# Patient Record
Sex: Female | Born: 1985 | Race: White | Hispanic: No | Marital: Single | Smoking: Current every day smoker
Health system: Southern US, Community
[De-identification: ages and names within clinical notes are randomized; demographics above are authoritative.]

## PROBLEM LIST (undated history)

## (undated) DIAGNOSIS — F1910 Other psychoactive substance abuse, uncomplicated: Secondary | ICD-10-CM

---

## 2015-03-04 ENCOUNTER — Emergency Department (HOSPITAL_COMMUNITY): Payer: Medicaid Other

## 2015-03-04 ENCOUNTER — Emergency Department (HOSPITAL_COMMUNITY)
Admission: EM | Admit: 2015-03-04 | Discharge: 2015-03-04 | Disposition: A | Discharge status: Home / Self Care | Payer: Medicaid Other | Attending: Emergency Medicine | Admitting: Emergency Medicine

## 2015-03-04 DIAGNOSIS — S3992XA Unspecified injury of lower back, initial encounter: Secondary | ICD-10-CM | POA: Insufficient documentation

## 2015-03-04 DIAGNOSIS — M25551 Pain in right hip: Secondary | ICD-10-CM

## 2015-03-04 DIAGNOSIS — M5440 Lumbago with sciatica, unspecified side: Secondary | ICD-10-CM | POA: Insufficient documentation

## 2015-03-04 DIAGNOSIS — Y9389 Activity, other specified: Secondary | ICD-10-CM | POA: Insufficient documentation

## 2015-03-04 DIAGNOSIS — R52 Pain, unspecified: Secondary | ICD-10-CM

## 2015-03-04 DIAGNOSIS — Y998 Other external cause status: Secondary | ICD-10-CM | POA: Diagnosis not present

## 2015-03-04 DIAGNOSIS — S8991XA Unspecified injury of right lower leg, initial encounter: Secondary | ICD-10-CM | POA: Diagnosis not present

## 2015-03-04 DIAGNOSIS — S79911A Unspecified injury of right hip, initial encounter: Secondary | ICD-10-CM | POA: Diagnosis not present

## 2015-03-04 DIAGNOSIS — M545 Low back pain: Secondary | ICD-10-CM

## 2015-03-04 DIAGNOSIS — Y9289 Other specified places as the place of occurrence of the external cause: Secondary | ICD-10-CM | POA: Insufficient documentation

## 2015-03-04 DIAGNOSIS — M79661 Pain in right lower leg: Secondary | ICD-10-CM

## 2015-03-04 LAB — CBC
HCT: 31.7 % — ABNORMAL LOW (ref 36.0–46.0)
Hemoglobin: 10.4 g/dL — ABNORMAL LOW (ref 12.0–15.0)
MCH: 28.2 pg (ref 26.0–34.0)
MCHC: 32.8 g/dL (ref 30.0–36.0)
MCV: 85.9 fL (ref 78.0–100.0)
PLATELETS: 280 10*3/uL (ref 150–400)
RBC: 3.69 MIL/uL — ABNORMAL LOW (ref 3.87–5.11)
RDW: 13.3 % (ref 11.5–15.5)
WBC: 9.2 10*3/uL (ref 4.0–10.5)

## 2015-03-04 LAB — SAMPLE TO BLOOD BANK

## 2015-03-04 LAB — COMPREHENSIVE METABOLIC PANEL
ALK PHOS: 41 U/L (ref 38–126)
ALT: 21 U/L (ref 14–54)
AST: 21 U/L (ref 15–41)
Albumin: 3.8 g/dL (ref 3.5–5.0)
Anion gap: 8 (ref 5–15)
BUN: 14 mg/dL (ref 6–20)
CALCIUM: 8.9 mg/dL (ref 8.9–10.3)
CHLORIDE: 105 mmol/L (ref 101–111)
CO2: 26 mmol/L (ref 22–32)
CREATININE: 0.89 mg/dL (ref 0.44–1.00)
Glucose, Bld: 57 mg/dL — ABNORMAL LOW (ref 65–99)
Potassium: 3.5 mmol/L (ref 3.5–5.1)
Sodium: 139 mmol/L (ref 135–145)
Total Bilirubin: 0.4 mg/dL (ref 0.3–1.2)
Total Protein: 6.6 g/dL (ref 6.5–8.1)

## 2015-03-04 LAB — PROTIME-INR
INR: 1.02 (ref 0.00–1.49)
Prothrombin Time: 13.6 seconds (ref 11.6–15.2)

## 2015-03-04 LAB — ETHANOL

## 2015-03-04 MED ORDER — ONDANSETRON HCL 4 MG/2ML IJ SOLN: 4.0000 mg | Freq: Four times a day (QID) | INTRAMUSCULAR | Status: DC | PRN

## 2015-03-04 MED ORDER — MORPHINE SULFATE (PF) 4 MG/ML IV SOLN: 4.0000 mg | INTRAVENOUS | Status: DC | PRN

## 2015-03-04 NOTE — ED Notes (Signed)
Family at beside. Family given emotional support. 
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
## 2015-03-04 NOTE — ED Notes (Signed)
The patient left prior to being discharged, walked out without any complications.  She asked if she could "walk out" and I advised her we could not keep her.  So she left with a family member.

## 2015-03-04 NOTE — ED Notes (Signed)
The patient was able to ambulate in the hall.
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
## 2015-03-04 NOTE — ED Notes (Signed)
Pt ambulated out the room multiple times. She was walking with no abnormalities.
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
## 2015-03-04 NOTE — ED Notes (Signed)
Patient transported to X-ray 

## 2015-03-04 NOTE — ED Notes (Signed)
CBG was 102

## 2015-03-05 LAB — CBG MONITORING, ED: GLUCOSE-CAPILLARY: 102 mg/dL — AB (ref 65–99)

## 2016-07-13 ENCOUNTER — Inpatient Hospital Stay (HOSPITAL_COMMUNITY)
Admission: EM | Admit: 2016-07-13 | Discharge: 2016-07-16 | DRG: 872 | Disposition: A | Discharge status: Home / Self Care | Payer: Medicaid Other | Attending: Internal Medicine | Admitting: Internal Medicine

## 2016-07-13 ENCOUNTER — Encounter (HOSPITAL_COMMUNITY): Payer: Self-pay

## 2016-07-13 DIAGNOSIS — E871 Hypo-osmolality and hyponatremia: Secondary | ICD-10-CM | POA: Diagnosis present

## 2016-07-13 DIAGNOSIS — I959 Hypotension, unspecified: Secondary | ICD-10-CM | POA: Diagnosis present

## 2016-07-13 DIAGNOSIS — R0989 Other specified symptoms and signs involving the circulatory and respiratory systems: Secondary | ICD-10-CM | POA: Diagnosis present

## 2016-07-13 DIAGNOSIS — E876 Hypokalemia: Secondary | ICD-10-CM

## 2016-07-13 DIAGNOSIS — A419 Sepsis, unspecified organism: Principal | ICD-10-CM | POA: Diagnosis present

## 2016-07-13 DIAGNOSIS — F1990 Other psychoactive substance use, unspecified, uncomplicated: Secondary | ICD-10-CM | POA: Diagnosis not present

## 2016-07-13 DIAGNOSIS — N12 Tubulo-interstitial nephritis, not specified as acute or chronic: Secondary | ICD-10-CM

## 2016-07-13 DIAGNOSIS — D72829 Elevated white blood cell count, unspecified: Secondary | ICD-10-CM

## 2016-07-13 DIAGNOSIS — D649 Anemia, unspecified: Secondary | ICD-10-CM | POA: Diagnosis present

## 2016-07-13 DIAGNOSIS — F1510 Other stimulant abuse, uncomplicated: Secondary | ICD-10-CM | POA: Diagnosis present

## 2016-07-13 DIAGNOSIS — R05 Cough: Secondary | ICD-10-CM | POA: Diagnosis present

## 2016-07-13 DIAGNOSIS — R109 Unspecified abdominal pain: Secondary | ICD-10-CM

## 2016-07-13 DIAGNOSIS — Z87440 Personal history of urinary (tract) infections: Secondary | ICD-10-CM

## 2016-07-13 DIAGNOSIS — Z1612 Extended spectrum beta lactamase (ESBL) resistance: Secondary | ICD-10-CM

## 2016-07-13 DIAGNOSIS — A498 Other bacterial infections of unspecified site: Secondary | ICD-10-CM

## 2016-07-13 HISTORY — DX: Other psychoactive substance abuse, uncomplicated: F19.10

## 2016-07-13 LAB — CBC WITH DIFFERENTIAL/PLATELET
BASOS PCT: 0 %
Basophils Absolute: 0 10*3/uL (ref 0.0–0.1)
EOS ABS: 0 10*3/uL (ref 0.0–0.7)
EOS PCT: 0 %
HCT: 31.7 % — ABNORMAL LOW (ref 36.0–46.0)
HEMOGLOBIN: 10.7 g/dL — AB (ref 12.0–15.0)
Lymphocytes Relative: 11 %
Lymphs Abs: 1.6 10*3/uL (ref 0.7–4.0)
MCH: 28.8 pg (ref 26.0–34.0)
MCHC: 33.8 g/dL (ref 30.0–36.0)
MCV: 85.4 fL (ref 78.0–100.0)
MONOS PCT: 11 %
Monocytes Absolute: 1.6 10*3/uL — ABNORMAL HIGH (ref 0.1–1.0)
NEUTROS PCT: 78 %
Neutro Abs: 10.8 10*3/uL — ABNORMAL HIGH (ref 1.7–7.7)
PLATELETS: 244 10*3/uL (ref 150–400)
RBC: 3.71 MIL/uL — ABNORMAL LOW (ref 3.87–5.11)
RDW: 12.8 % (ref 11.5–15.5)
WBC: 14 10*3/uL — AB (ref 4.0–10.5)

## 2016-07-13 LAB — COMPREHENSIVE METABOLIC PANEL
ALK PHOS: 50 U/L (ref 38–126)
ALT: 29 U/L (ref 14–54)
AST: 35 U/L (ref 15–41)
Albumin: 3.1 g/dL — ABNORMAL LOW (ref 3.5–5.0)
Anion gap: 8 (ref 5–15)
BUN: 8 mg/dL (ref 6–20)
CALCIUM: 8.1 mg/dL — AB (ref 8.9–10.3)
CO2: 27 mmol/L (ref 22–32)
CREATININE: 0.68 mg/dL (ref 0.44–1.00)
Chloride: 97 mmol/L — ABNORMAL LOW (ref 101–111)
Glucose, Bld: 126 mg/dL — ABNORMAL HIGH (ref 65–99)
Potassium: 2.9 mmol/L — ABNORMAL LOW (ref 3.5–5.1)
Sodium: 132 mmol/L — ABNORMAL LOW (ref 135–145)
Total Bilirubin: 0.6 mg/dL (ref 0.3–1.2)
Total Protein: 6.8 g/dL (ref 6.5–8.1)

## 2016-07-13 LAB — URINALYSIS, ROUTINE W REFLEX MICROSCOPIC
Bilirubin Urine: NEGATIVE
GLUCOSE, UA: NEGATIVE mg/dL
Ketones, ur: NEGATIVE mg/dL
NITRITE: NEGATIVE
PH: 6 (ref 5.0–8.0)
PROTEIN: NEGATIVE mg/dL
SPECIFIC GRAVITY, URINE: 1.002 — AB (ref 1.005–1.030)

## 2016-07-13 LAB — I-STAT CG4 LACTIC ACID, ED: Lactic Acid, Venous: 1.58 mmol/L (ref 0.5–1.9)

## 2016-07-13 MED ORDER — ORAL CARE MOUTH RINSE: 15.0000 mL | Freq: Two times a day (BID) | OROMUCOSAL | Status: DC

## 2016-07-13 MED ORDER — SODIUM CHLORIDE 0.9 % IV BOLUS (SEPSIS)
1000.0000 mL | Freq: Once | INTRAVENOUS | Status: AC
  Administered 2016-07-13: 1000 mL via INTRAVENOUS

## 2016-07-13 MED ORDER — ENSURE ENLIVE PO LIQD
237.0000 mL | Freq: Two times a day (BID) | ORAL | Status: DC
  Administered 2016-07-14 – 2016-07-16 (×3): 237 mL via ORAL

## 2016-07-13 MED ORDER — VANCOMYCIN HCL IN DEXTROSE 750-5 MG/150ML-% IV SOLN
750.0000 mg | Freq: Three times a day (TID) | INTRAVENOUS | Status: DC
  Administered 2016-07-13 – 2016-07-14 (×2): 750 mg via INTRAVENOUS
  Filled 2016-07-13 (×3): qty 150

## 2016-07-13 MED ORDER — ENOXAPARIN SODIUM 40 MG/0.4ML ~~LOC~~ SOLN
40.0000 mg | SUBCUTANEOUS | Status: DC
  Administered 2016-07-13 – 2016-07-14 (×2): 40 mg via SUBCUTANEOUS
  Filled 2016-07-13 (×3): qty 0.4

## 2016-07-13 MED ORDER — ACETAMINOPHEN 650 MG RE SUPP: 650.0000 mg | Freq: Four times a day (QID) | RECTAL | Status: DC | PRN

## 2016-07-13 MED ORDER — SODIUM CHLORIDE 0.9 % IV SOLN
INTRAVENOUS | Status: DC
  Administered 2016-07-13: 1000 mL via INTRAVENOUS
  Administered 2016-07-14 – 2016-07-16 (×4): via INTRAVENOUS

## 2016-07-13 MED ORDER — ACETAMINOPHEN 325 MG PO TABS
650.0000 mg | ORAL_TABLET | Freq: Four times a day (QID) | ORAL | Status: DC | PRN
  Administered 2016-07-13 – 2016-07-14 (×2): 650 mg via ORAL
  Filled 2016-07-13 (×3): qty 2

## 2016-07-13 MED ORDER — DEXTROSE 5 % IV SOLN
1.0000 g | Freq: Three times a day (TID) | INTRAVENOUS | Status: DC
  Administered 2016-07-13 – 2016-07-16 (×8): 1 g via INTRAVENOUS
  Filled 2016-07-13 (×10): qty 1

## 2016-07-13 MED ORDER — CHLORHEXIDINE GLUCONATE 0.12 % MT SOLN
15.0000 mL | Freq: Two times a day (BID) | OROMUCOSAL | Status: DC
  Administered 2016-07-14 – 2016-07-16 (×4): 15 mL via OROMUCOSAL
  Filled 2016-07-13 (×6): qty 15

## 2016-07-13 MED ORDER — VANCOMYCIN HCL IN DEXTROSE 1-5 GM/200ML-% IV SOLN
1000.0000 mg | Freq: Once | INTRAVENOUS | Status: AC
  Administered 2016-07-13: 1000 mg via INTRAVENOUS
  Filled 2016-07-13: qty 200

## 2016-07-13 MED ORDER — PIPERACILLIN-TAZOBACTAM 3.375 G IVPB 30 MIN
3.3750 g | Freq: Once | INTRAVENOUS | Status: AC
  Administered 2016-07-13: 3.375 g via INTRAVENOUS
  Filled 2016-07-13: qty 50

## 2016-07-13 NOTE — ED Notes (Signed)
Bed: WA04 Expected date:  Expected time:  Means of arrival:  Comments: 31 yo sepsis
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
## 2016-07-13 NOTE — Progress Notes (Signed)
Pharmacy Antibiotic Note  Christine CrewsCasey Mcguire is a 31 y.o. female admitted on 07/13/2016 with sepsis. Pt states that for the past 3 days she has had flank pain, dysuria, dark urine, generalized weakness, nausea, anorexia, fever.  Patient acknowledges using meth.  No clear precipitant. Pharmacy initally consulted for vancomycin and zosyn (changed zosyn to cefepime) dosing, received zosyn 3.375 mg x 1 and vancomycin 1gm x 1 in ED.  Plan: Vancomycin 750mg  IV q8h Cefepime 1gm IV q8h Follow renal function, cultures and clinical course vanc trough at steady state  Height: 5\' 3"  (160 cm) Weight: 140 lb (63.5 kg) IBW/kg (Calculated) : 52.4  Temp (24hrs), Avg:99.1 F (37.3 C), Min:99.1 F (37.3 C), Max:99.1 F (37.3 C)   Recent Labs Lab 07/13/16 1152 07/13/16 1200  WBC 14.0*  --   CREATININE 0.68  --   LATICACIDVEN  --  1.58    Estimated Creatinine Clearance: 93.9 mL/min (by C-G formula based on SCr of 0.68 mg/dL).    No Known Allergies  Antimicrobials this admission: 5/3 vancomycin >> 5/3 zosyn x1 5/3 cefepime >>   Microbiology results: 5/3 BCx: sent  Thank you for allowing pharmacy to be a part of this patient's care.  Arley Phenixllen Donjuan Robison RPh 07/13/2016, 1:49 PM Pager [phone]

## 2016-07-13 NOTE — H&P (Signed)
History and Physical    Christine Mcguire ZOX:[id number] DOB: Sep 29, 1985 DOA: 07/13/2016  I have briefly reviewed the patient's prior medical records in [facility]  PCP: No PCP Per Patient  Patient coming from: Home  Chief Complaint: Flank pain, dysuria  HPI: Christine Mcguire is a 31 y.o. female with medical history significant of drug abuse, presents to the emergency room from an urgent care center with 3 days of flank pain on the right side, burning with urination, dark urine as well as subjective fever and chills.  She also has had nausea, generalized weakness.  She tells me she has had kidney infections in the past.  She denies any chest pain or shortness of breath.  She is somewhat sleepy in the emergency room however is able to wake up and answers questions appropriately.  She has a history of drug abuse with meth, no other drugs or alcohol.  She denies any lightheadedness or dizziness.  No cough or chest congestion.  ED Course: In the ED she was initially hypotensive, low-grade temp, she was treated this sepsis and had blood cultures, she was started on vancomycin and cefepime empirically.  Blood work showed elevated white count of 14.  TR[facility] was asked for admission for sepsis due to pyelonephritis.  UA grossly positive.  Review of Systems: As per HPI otherwise 10 point review of systems negative.   Past Medical History:  Diagnosis Date  . Drug abuse     History reviewed. No pertinent surgical history.   reports that she has never smoked. She has never used smokeless tobacco. She reports that she uses drugs, including Methamphetamines. She reports that she does not drink alcohol.  No Known Allergies  History reviewed. No pertinent family history.  Prior to Admission medications   Medication Sig Start Date End Date Taking? Authorizing Provider  ibuprofen (ADVIL,MOTRIN) 200 MG tablet Take 400 mg by mouth every 6 (six) hours as needed.   Yes Historical Provider, MD    Physical  Exam: Vitals:   07/13/16 1151 07/13/16 1206 07/13/16 1330  BP:  (!) 97/48 (!) 112/53  Pulse:  96 94  Resp:  17 15  Temp:  99.1 F (37.3 C) 97.5 F (36.4 C)  TempSrc:  Oral Oral  SpO2: 95% 100% 100%  Weight:  63.5 kg (140 lb)   Height:  5\' 3"  (1.6 m)     Constitutional: No apparent distress, sleepy Vitals:   07/13/16 1151 07/13/16 1206 07/13/16 1330  BP:  (!) 97/48 (!) 112/53  Pulse:  96 94  Resp:  17 15  Temp:  99.1 F (37.3 C) 97.5 F (36.4 C)  TempSrc:  Oral Oral  SpO2: 95% 100% 100%  Weight:  63.5 kg (140 lb)   Height:  5\' 3"  (1.6 m)    Eyes: PERRL, lids and conjunctivae normal ENMT: Mucous membranes are moist. Posterior pharynx clear of any exudate or lesions. Neck: normal, supple, no masses, no thyromegaly Respiratory: clear to auscultation bilaterally, no wheezing, no crackles. Normal respiratory effort. No accessory muscle use.  Cardiovascular: Regular rate and rhythm, no murmurs / rubs / gallops. No extremity edema. 2+ pedal pulses.  Abdomen: Tenderness on right upper quadrant. Bowel sounds positive.  Musculoskeletal: no clubbing / cyanosis. Normal muscle tone.  Has CVA tenderness on the right Skin: Multiple cuts bilateral lower extremities Neurologic: CN 2-12 grossly intact. Strength 5/5 in all 4.  Psychiatric: Normal judgment and insight. Alert and oriented x 3. Normal mood.   Labs on Admission:  I have personally reviewed following labs and imaging studies  CBC:  Recent Labs Lab 07/13/16 1152  WBC 14.0*  NEUTROABS 10.8*  HGB 10.7*  HCT 31.7*  MCV 85.4  PLT 244   Basic Metabolic Panel:  Recent Labs Lab 07/13/16 1152  NA 132*  K 2.9*  CL 97*  CO2 27  GLUCOSE 126*  BUN 8  CREATININE 0.68  CALCIUM 8.1*   GFR: Estimated Creatinine Clearance: 93.9 mL/min (by C-G formula based on SCr of 0.68 mg/dL). Liver Function Tests:  Recent Labs Lab 07/13/16 1152  AST 35  ALT 29  ALKPHOS 50  BILITOT 0.6  PROT 6.8  ALBUMIN 3.1*   No results for  input(s): LIPASE, AMYLASE in the last 168 hours. No results for input(s): AMMONIA in the last 168 hours. Coagulation Profile: No results for input(s): INR, PROTIME in the last 168 hours. Cardiac Enzymes: No results for input(s): CKTOTAL, CKMB, CKMBINDEX, TROPONINI in the last 168 hours. BNP (last 3 results) No results for input(s): PROBNP in the last 8760 hours. HbA1C: No results for input(s): HGBA1C in the last 72 hours. CBG: No results for input(s): GLUCAP in the last 168 hours. Lipid Profile: No results for input(s): CHOL, HDL, LDLCALC, TRIG, CHOLHDL, LDLDIRECT in the last 72 hours. Thyroid Function Tests: No results for input(s): TSH, T4TOTAL, FREET4, T3FREE, THYROIDAB in the last 72 hours. Anemia Panel: No results for input(s): VITAMINB12, FOLATE, FERRITIN, TIBC, IRON, RETICCTPCT in the last 72 hours. Urine analysis: No results found for: COLORURINE, APPEARANCEUR, LABSPEC, PHURINE, GLUCOSEU, HGBUR, BILIRUBINUR, KETONESUR, PROTEINUR, UROBILINOGEN, NITRITE, LEUKOCYTESUR   Radiological Exams on Admission: No results found.  EKG: Independently reviewed.  Sinus rhythm  Assessment/Plan Active Problems:   Pyelonephritis   Drug use   She is due to pyelonephritis -Admit patient to the hospital, given septic picture on presentation and history of drug use, agree with vancomycin and cefepime for now while awaiting blood cultures -Obtain HIV, obtain urine cultures as well  Drug use -Patient with somewhat flat flattened affect when this is brought up   DVT prophylaxis: Lovenox Code Status: Full code Family Communication: No family at bedside Disposition Plan: Admit to MedSurg Consults called: None    Admission status: Observation  At the point of initial evaluation, it is my clinical opinion that admission for OBSERVATION is reasonable and necessary because the patient's presenting complaints in the context of their chronic conditions represent sufficient risk of  deterioration or significant morbidity to constitute reasonable grounds for close observation in the hospital setting, but that the patient may be medically stable for discharge from the hospital within 24 to 48 hours.    Pamella Pertostin Gurman [place], MD [facility] Pager [phone]  If 7PM-7AM, please contact night-coverage [email or web address] Password [email or web address]  07/13/2016, 2:30 PM

## 2016-07-13 NOTE — ED Triage Notes (Signed)
Per EMS, pt sent from urgent care for sepsis.  Pt complains of bilateral flank pain radiating to vaginal area, dark cloudy urine, fever, malaise, dizziness x 4 days. Pt has hx of IV methamphetamine use, last used 4 days ago. Pt received 1000mg  tylenol, 800mg  ibuprofen, 500cc NS en route.    BP 110/66 HR 103 RR 20 T 103.7 orally CBG 147

## 2016-07-14 ENCOUNTER — Observation Stay (HOSPITAL_COMMUNITY): Payer: Self-pay

## 2016-07-14 DIAGNOSIS — D72829 Elevated white blood cell count, unspecified: Secondary | ICD-10-CM

## 2016-07-14 DIAGNOSIS — F1990 Other psychoactive substance use, unspecified, uncomplicated: Secondary | ICD-10-CM | POA: Diagnosis not present

## 2016-07-14 DIAGNOSIS — R109 Unspecified abdominal pain: Secondary | ICD-10-CM

## 2016-07-14 DIAGNOSIS — N12 Tubulo-interstitial nephritis, not specified as acute or chronic: Secondary | ICD-10-CM | POA: Diagnosis not present

## 2016-07-14 DIAGNOSIS — E876 Hypokalemia: Secondary | ICD-10-CM

## 2016-07-14 LAB — CBC
HEMATOCRIT: 31.3 % — AB (ref 36.0–46.0)
Hemoglobin: 10.5 g/dL — ABNORMAL LOW (ref 12.0–15.0)
MCH: 28.8 pg (ref 26.0–34.0)
MCHC: 33.5 g/dL (ref 30.0–36.0)
MCV: 85.8 fL (ref 78.0–100.0)
Platelets: 182 10*3/uL (ref 150–400)
RBC: 3.65 MIL/uL — ABNORMAL LOW (ref 3.87–5.11)
RDW: 13 % (ref 11.5–15.5)
WBC: 11 10*3/uL — ABNORMAL HIGH (ref 4.0–10.5)

## 2016-07-14 LAB — COMPREHENSIVE METABOLIC PANEL
ALBUMIN: 2.5 g/dL — AB (ref 3.5–5.0)
ALT: 25 U/L (ref 14–54)
AST: 28 U/L (ref 15–41)
Alkaline Phosphatase: 44 U/L (ref 38–126)
Anion gap: 7 (ref 5–15)
BILIRUBIN TOTAL: 0.4 mg/dL (ref 0.3–1.2)
BUN: 9 mg/dL (ref 6–20)
CALCIUM: 7.7 mg/dL — AB (ref 8.9–10.3)
CO2: 24 mmol/L (ref 22–32)
Chloride: 105 mmol/L (ref 101–111)
Creatinine, Ser: 0.58 mg/dL (ref 0.44–1.00)
GFR calc Af Amer: >60 mL/min (ref >60)
GFR calc non Af Amer: >60 mL/min (ref >60)
GLUCOSE: 167 mg/dL — AB (ref 65–99)
Potassium: 3 mmol/L — ABNORMAL LOW (ref 3.5–5.1)
Sodium: 136 mmol/L (ref 135–145)
TOTAL PROTEIN: 5.5 g/dL — AB (ref 6.5–8.1)

## 2016-07-14 LAB — PREGNANCY, URINE: PREG TEST UR: NEGATIVE

## 2016-07-14 LAB — HIV ANTIBODY (ROUTINE TESTING W REFLEX): HIV Screen 4th Generation wRfx: NONREACTIVE

## 2016-07-14 LAB — MAGNESIUM: MAGNESIUM: 1.4 mg/dL — AB (ref 1.7–2.4)

## 2016-07-14 MED ORDER — IOPAMIDOL (ISOVUE-300) INJECTION 61%
INTRAVENOUS | Status: AC
  Administered 2016-07-14: 15 mL via ORAL
  Filled 2016-07-14: qty 30

## 2016-07-14 MED ORDER — KETOROLAC TROMETHAMINE 30 MG/ML IJ SOLN
30.0000 mg | Freq: Four times a day (QID) | INTRAMUSCULAR | Status: DC | PRN
  Administered 2016-07-14 – 2016-07-15 (×4): 30 mg via INTRAVENOUS
  Filled 2016-07-14 (×4): qty 1

## 2016-07-14 MED ORDER — IOPAMIDOL (ISOVUE-300) INJECTION 61%
INTRAVENOUS | Status: AC
  Filled 2016-07-14: qty 100

## 2016-07-14 MED ORDER — CALAMINE EX LOTN
TOPICAL_LOTION | CUTANEOUS | Status: DC | PRN
  Administered 2016-07-14: 12:00:00 via TOPICAL
  Filled 2016-07-14: qty 118

## 2016-07-14 MED ORDER — ACETAMINOPHEN 650 MG RE SUPP: 650.0000 mg | Freq: Four times a day (QID) | RECTAL | Status: DC | PRN

## 2016-07-14 MED ORDER — ONDANSETRON HCL 4 MG/2ML IJ SOLN
4.0000 mg | Freq: Four times a day (QID) | INTRAMUSCULAR | Status: DC
  Administered 2016-07-14 (×2): 4 mg via INTRAVENOUS
  Filled 2016-07-14 (×3): qty 2

## 2016-07-14 MED ORDER — IOPAMIDOL (ISOVUE-300) INJECTION 61%
100.0000 mL | Freq: Once | INTRAVENOUS | Status: AC | PRN
  Administered 2016-07-14: 100 mL via INTRAVENOUS

## 2016-07-14 MED ORDER — POTASSIUM CHLORIDE CRYS ER 20 MEQ PO TBCR
40.0000 meq | EXTENDED_RELEASE_TABLET | ORAL | Status: AC
  Administered 2016-07-14 (×2): 40 meq via ORAL
  Filled 2016-07-14 (×2): qty 2

## 2016-07-14 MED ORDER — MAGNESIUM SULFATE 2 GM/50ML IV SOLN
2.0000 g | Freq: Once | INTRAVENOUS | Status: AC
  Administered 2016-07-14: 2 g via INTRAVENOUS
  Filled 2016-07-14: qty 50

## 2016-07-14 MED ORDER — PROMETHAZINE HCL 25 MG/ML IJ SOLN
25.0000 mg | Freq: Four times a day (QID) | INTRAMUSCULAR | Status: DC | PRN
Start: 2016-07-14 — End: 2016-07-16

## 2016-07-14 MED ORDER — ACETAMINOPHEN 325 MG PO TABS
650.0000 mg | ORAL_TABLET | ORAL | Status: DC | PRN
  Administered 2016-07-14 – 2016-07-15 (×2): 650 mg via ORAL
  Filled 2016-07-14 (×2): qty 2

## 2016-07-14 MED ORDER — IOPAMIDOL (ISOVUE-300) INJECTION 61%
15.0000 mL | Freq: Once | INTRAVENOUS | Status: AC | PRN
  Administered 2016-07-14: 15 mL via ORAL
  Filled 2016-07-14: qty 30

## 2016-07-14 NOTE — Progress Notes (Signed)
CSW received consult to speak with patient regarding substance abuse. Patient stated her head was hurting and "I dont feel like talking right now". CSW asked if she could leave resources for patient in room for later review- patient replied yes.   Stacy GardnerErin Aislynn Cifelli, LCSWA Clinical Social Worker [phone]

## 2016-07-14 NOTE — Progress Notes (Signed)
1355 Pt began shaking   after drinking all contrast  That was ordered. She stated tht her abdomen hurt and she felt overly full.T 98.2 P 116 R18 ,BP 141/115. Dr Butler Denmarkizwan notified.   1411 T 101.1 P  111 R 22 BP 120/60. I have paged Dr Butler Denmarkizwan

## 2016-07-14 NOTE — Progress Notes (Signed)
Tylenol  650 mg given for T102.2. Dr Luvenia Reddenizwa was notified that tylenol was given. No orders received

## 2016-07-14 NOTE — Care Management Note (Signed)
Case Management Note  Patient Details  Name: Christine Mcguire MRN: [id number] Date of Birth: [birth year][name]  Subjective/Objective:                 pyelonephritis   Action/Plan: From Home [id number]/[name], BSN, RN3, [facility]/[id number] Chart review for patient progression. Chart review for case management needs: Next review due on [id number].  Expected Discharge Date:   (unknown)               Expected Discharge Plan:  Home/Self Care  In-House Referral:     Discharge planning Services     Post Acute Care Choice:    Choice offered to:     DME Arranged:    DME Agency:     HH Arranged:    HH Agency:     Status of Service:  In process, will continue to follow  If discussed at Long Length of Stay Meetings, dates discussed:    Additional Comments:  Golda AcreDavis, Rhonda Lynn, RN 07/14/2016, 8:46 AM

## 2016-07-14 NOTE — Progress Notes (Signed)
PROGRESS NOTE    Christine Mcguire   ZOX:[id number]  DOB: 01/06/1986  DOA: 07/13/2016 PCP: No PCP Per Patient   Brief Narrative:   Christine Mcguire is a 31 y.o. female with medical history significant of drug abuse, presents to the emergency room from an urgent care center with 3 days of flank pain on the right side, burning with urination, dark urine as well as subjective fever and chills. Her last UTI was 1 month ago. She has right flank pain for the past 2 yrs but states she has not had a work up for it. The pain is much worse now. Also complains of cough and chest congestion. Not coughing up mucous. No dyspnea. No sore throat. Nauseated but tolerating solid food and eating and drinking fine.   Subjective: See above  Assessment & Plan:   Principal Problem:   Pyelonephritis/   Leukocytosis - awaiting cultures - second UTI in past few months - cont Cefepime- d/c Vanc - control pain with Toradol and Tylenol - severely tender in RLQ- obtain CT abd/pelvis to look for nephrolithiasis, abscess and other pathology as her pain is chronic as well  Active Problems:   Hypokalemia   Hypomagnesemia - replacing  Hypotension - given 2 L NS in ER and on continuous fluilds- may be her norm- will cont to follow  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   DVT prophylaxis: Lovenox Code Status: Full code Family Communication:  Disposition Plan: home when stable Consultants:    Procedures:    Antimicrobials:  Anti-infectives    Start     Dose/Rate Route Frequency Ordered Stop   07/13/16 2200  vancomycin (VANCOCIN) IVPB 750 mg/150 ml premix  Status:  Discontinued     750 mg 150 mL/hr over 60 Minutes Intravenous Every 8 hours 07/13/16 1357 07/14/16 0834   07/13/16 1800  ceFEPIme (MAXIPIME) 1 g in dextrose 5 % 50 mL IVPB     1 g 100 mL/hr over 30 Minutes Intravenous Every 8 hours 07/13/16 1355     07/13/16 1200  piperacillin-tazobactam (ZOSYN) IVPB 3.375 g     3.375 g 100  mL/hr over 30 Minutes Intravenous  Once 07/13/16 1154 07/13/16 1341   07/13/16 1200  vancomycin (VANCOCIN) IVPB 1000 mg/200 mL premix     1,000 mg 200 mL/hr over 60 Minutes Intravenous  Once 07/13/16 1154 07/13/16 1511       Objective: Vitals:   07/13/16 1800 07/13/16 1901 07/13/16 2144 07/14/16 0500  BP: (!) 81/41 (!) 90/50 (!) 92/58 (!) 102/47  Pulse: 97  75 (!) 104  Resp:   18 18  Temp:   97.3 F (36.3 C) 98.5 F (36.9 C)  TempSrc:    Oral  SpO2:   100% 100%  Weight:      Height:        Intake/Output Summary (Last 24 hours) at 07/14/16 1147 Last data filed at 07/14/16 0700  Gross per 24 hour  Intake             2890 ml  Output              800 ml  Net             2090 ml   Filed Weights   07/13/16 1206 07/13/16 1601  Weight: 63.5 kg (140 lb) 63.5 kg (139 lb 15.9 oz)    Examination: General exam: Appears comfortable  HEENT: PERRLA, oral mucosa moist, no sclera icterus or thrush  Respiratory system: Clear to auscultation. Respiratory effort normal. Cardiovascular system: S1 & S2 heard, RRR.  No murmurs  Gastrointestinal system: Abdomen soft, tender in LUQ, LLQ and epigastrium, nondistended. Normal bowel sound. No organomegaly Back: R CVA tenderness Central nervous system: Alert and oriented. No focal neurological deficits. Extremities: No cyanosis, clubbing or edema Skin: No rashes or ulcers Psychiatry:  Mood & affect appropriate.     Data Reviewed: I have personally reviewed following labs and imaging studies  CBC:  Recent Labs Lab 07/13/16 1152 07/14/16 0733  WBC 14.0* 11.0*  NEUTROABS 10.8*  --   HGB 10.7* 10.5*  HCT 31.7* 31.3*  MCV 85.4 85.8  PLT 244 182   Basic Metabolic Panel:  Recent Labs Lab 07/13/16 1152 07/14/16 0733  NA 132* 136  K 2.9* 3.0*  CL 97* 105  CO2 27 24  GLUCOSE 126* 167*  BUN 8 9  CREATININE 0.68 0.58  CALCIUM 8.1* 7.7*  MG  --  1.4*   GFR: Estimated Creatinine Clearance: 90.4 mL/min (by C-G formula based on SCr  of 0.58 mg/dL). Liver Function Tests:  Recent Labs Lab 07/13/16 1152 07/14/16 0733  AST 35 28  ALT 29 25  ALKPHOS 50 44  BILITOT 0.6 0.4  PROT 6.8 5.5*  ALBUMIN 3.1* 2.5*   No results for input(s): LIPASE, AMYLASE in the last 168 hours. No results for input(s): AMMONIA in the last 168 hours. Coagulation Profile: No results for input(s): INR, PROTIME in the last 168 hours. Cardiac Enzymes: No results for input(s): CKTOTAL, CKMB, CKMBINDEX, TROPONINI in the last 168 hours. BNP (last 3 results) No results for input(s): PROBNP in the last 8760 hours. HbA1C: No results for input(s): HGBA1C in the last 72 hours. CBG: No results for input(s): GLUCAP in the last 168 hours. Lipid Profile: No results for input(s): CHOL, HDL, LDLCALC, TRIG, CHOLHDL, LDLDIRECT in the last 72 hours. Thyroid Function Tests: No results for input(s): TSH, T4TOTAL, FREET4, T3FREE, THYROIDAB in the last 72 hours. Anemia Panel: No results for input(s): VITAMINB12, FOLATE, FERRITIN, TIBC, IRON, RETICCTPCT in the last 72 hours. Urine analysis:    Component Value Date/Time   COLORURINE YELLOW 07/13/2016 1340   APPEARANCEUR HAZY (A) 07/13/2016 1340   LABSPEC 1.002 (L) 07/13/2016 1340   PHURINE 6.0 07/13/2016 1340   GLUCOSEU NEGATIVE 07/13/2016 1340   HGBUR SMALL (A) 07/13/2016 1340   BILIRUBINUR NEGATIVE 07/13/2016 1340   KETONESUR NEGATIVE 07/13/2016 1340   PROTEINUR NEGATIVE 07/13/2016 1340   NITRITE NEGATIVE 07/13/2016 1340   LEUKOCYTESUR LARGE (A) 07/13/2016 1340   Sepsis Labs: @LABRCNTIP (procalcitonin:4,lacticidven:4) )No results found for this or any previous visit (from the past 240 hour(s)).       Radiology Studies: No results found.    Scheduled Meds: . chlorhexidine  15 mL Mouth Rinse BID  . enoxaparin (LOVENOX) injection  40 mg Subcutaneous Q24H  . feeding supplement (ENSURE ENLIVE)  237 mL Oral BID BM  . mouth rinse  15 mL Mouth Rinse q12n4p  . ondansetron (ZOFRAN) IV  4 mg  Intravenous Q6H  . potassium chloride  40 mEq Oral Q4H   Continuous Infusions: . sodium chloride 1,000 mL (07/13/16 1622)  . ceFEPime (MAXIPIME) IV Stopped (07/14/16 0539)  . magnesium sulfate 1 - 4 g bolus IVPB 2 g (07/14/16 1112)     LOS: 0 days    Time spent in minutes: 35    Calvert CantorSaima Eathon Valade, MD [facility] Pager: [email or web address] Password [email or web address] 07/14/2016, 11:47 AM

## 2016-07-14 NOTE — Progress Notes (Signed)
Nutrition Brief Note  Patient identified on the Malnutrition Screening Tool (MST) Report  31 y.o. female with medical history significant of drug abuse admitted for pyelonephritis. Per MD note, if medically stable pt to possibly discharge in 24 hrs.   Wt Readings from Last 15 Encounters:  07/13/16 139 lb 15.9 oz (63.5 kg)    Body mass index is 24.03 kg/m. Patient meets criteria for normal weight based on current BMI.   Current diet order is regular with Ensure BID, patient is consuming approximately 100% of meals at this time. Labs and medications reviewed.   No nutrition interventions warranted at this time. If nutrition issues arise, please consult RD.  Betsey Holidayasey Emelyn Roen, RD, LDN Pager #- [phone]

## 2016-07-15 DIAGNOSIS — F1990 Other psychoactive substance use, unspecified, uncomplicated: Secondary | ICD-10-CM | POA: Diagnosis not present

## 2016-07-15 DIAGNOSIS — N12 Tubulo-interstitial nephritis, not specified as acute or chronic: Secondary | ICD-10-CM | POA: Diagnosis not present

## 2016-07-15 DIAGNOSIS — E876 Hypokalemia: Secondary | ICD-10-CM | POA: Diagnosis not present

## 2016-07-15 LAB — CBC
HEMATOCRIT: 29.4 % — AB (ref 36.0–46.0)
Hemoglobin: 9.8 g/dL — ABNORMAL LOW (ref 12.0–15.0)
MCH: 28.7 pg (ref 26.0–34.0)
MCHC: 33.3 g/dL (ref 30.0–36.0)
MCV: 86.2 fL (ref 78.0–100.0)
Platelets: 209 10*3/uL (ref 150–400)
RBC: 3.41 MIL/uL — ABNORMAL LOW (ref 3.87–5.11)
RDW: 13.4 % (ref 11.5–15.5)
WBC: 10 10*3/uL (ref 4.0–10.5)

## 2016-07-15 LAB — BASIC METABOLIC PANEL
Anion gap: 5 (ref 5–15)
BUN: 9 mg/dL (ref 6–20)
CALCIUM: 8 mg/dL — AB (ref 8.9–10.3)
CO2: 26 mmol/L (ref 22–32)
Chloride: 109 mmol/L (ref 101–111)
Creatinine, Ser: 0.52 mg/dL (ref 0.44–1.00)
Glucose, Bld: 99 mg/dL (ref 65–99)
Potassium: 4 mmol/L (ref 3.5–5.1)
Sodium: 140 mmol/L (ref 135–145)

## 2016-07-15 LAB — MAGNESIUM: Magnesium: 1.7 mg/dL (ref 1.7–2.4)

## 2016-07-15 MED ORDER — ONDANSETRON HCL 4 MG/2ML IJ SOLN: 4.0000 mg | Freq: Four times a day (QID) | INTRAMUSCULAR | Status: DC | PRN

## 2016-07-15 NOTE — Progress Notes (Signed)
PROGRESS NOTE    Christine Mcguire   ONG:[id number]  DOB: 29-11-1985  DOA: 07/13/2016 PCP: Patient, No Pcp Per   Brief Narrative:   Christine Mcguire is a 31 y.o. female with medical history significant of drug abuse, presents to the emergency room from an urgent care center with 3 days of flank pain on the right side, burning with urination, dark urine as well as subjective fever and chills. Her last UTI was 1 month ago. She has right flank pain for the past 2 yrs but states she has not had a work up for it. The pain is much worse now. Also complains of cough and chest congestion. Not coughing up mucous. No dyspnea. No sore throat. Nauseated but tolerating solid food and eating and drinking fine.   Subjective: Pain is improving. Not feverish.   Assessment & Plan:   Principal Problem:   Pyelonephritis/   Leukocytosis - cont Cefepime- d/c Vanc - control pain with Toradol and Tylenol - temp 100.6 last night - awaiting cultures - second UTI in past few months - severely tender in RLQ- obtained CT abd/pelvis to look for nephrolithiasis, abscess and other pathology as her pain is chronic as well- CT consistent with b/l Pyelonephritis Right > Left- not tender on left and doubtful she has left sided pyelo - U culture >> E coli  Active Problems:   Hypokalemia   Hypomagnesemia - replaced  Hypotension- 90/50 - given 2 L NS in ER and on continuous fluilds- BP Improved  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   DVT prophylaxis: Lovenox Code Status: Full code Family Communication:  Disposition Plan: home when stable Consultants:    Procedures:    Antimicrobials:  Anti-infectives    Start     Dose/Rate Route Frequency Ordered Stop   07/13/16 2200  vancomycin (VANCOCIN) IVPB 750 mg/150 ml premix  Status:  Discontinued     750 mg 150 mL/hr over 60 Minutes Intravenous Every 8 hours 07/13/16 1357 07/14/16 0834   07/13/16 1800  ceFEPIme (MAXIPIME) 1 g in dextrose 5 % 50 mL  IVPB     1 g 100 mL/hr over 30 Minutes Intravenous Every 8 hours 07/13/16 1355     07/13/16 1200  piperacillin-tazobactam (ZOSYN) IVPB 3.375 g     3.375 g 100 mL/hr over 30 Minutes Intravenous  Once 07/13/16 1154 07/13/16 1341   07/13/16 1200  vancomycin (VANCOCIN) IVPB 1000 mg/200 mL premix     1,000 mg 200 mL/hr over 60 Minutes Intravenous  Once 07/13/16 1154 07/13/16 1511       Objective: Vitals:   07/14/16 2038 07/15/16 0154 07/15/16 0513 07/15/16 1316  BP: (!) 113/55  (!) 116/54 (!) 124/59  Pulse: 85  (!) 112 85  Resp: 18  18 18   Temp: 98.4 F (36.9 C) (!) 100.6 F (38.1 C) 98.3 F (36.8 C) 98.4 F (36.9 C)  TempSrc: Oral Oral Oral Oral  SpO2: 100%  100% 100%  Weight:      Height:        Intake/Output Summary (Last 24 hours) at 07/15/16 1508 Last data filed at 07/15/16 1316  Gross per 24 hour  Intake             2430 ml  Output                0 ml  Net             2430 ml   [employer]  07/13/16 1206 07/13/16 1601  Weight: 63.5 kg (140 lb) 63.5 kg (139 lb 15.9 oz)    Examination: General exam: Appears comfortable  HEENT: PERRLA, oral mucosa moist, no sclera icterus or thrush Respiratory system: Clear to auscultation. Respiratory effort normal. Cardiovascular system: S1 & S2 heard, RRR.  No murmurs  Gastrointestinal system: Abdomen soft, tender in epigastrium, nondistended. Normal bowel sound. No organomegaly Back: R CVA tenderness Central nervous system: Alert and oriented. No focal neurological deficits. Extremities: No cyanosis, clubbing or edema Skin: No rashes or ulcers Psychiatry:  Mood & affect appropriate.     Data Reviewed: I have personally reviewed following labs and imaging studies  CBC:  Recent Labs Lab 07/13/16 1152 07/14/16 0733 07/15/16 0701  WBC 14.0* 11.0* 10.0  NEUTROABS 10.8*  --   --   HGB 10.7* 10.5* 9.8*  HCT 31.7* 31.3* 29.4*  MCV 85.4 85.8 86.2  PLT 244 182 209   Basic Metabolic Panel:  Recent Labs Lab  07/13/16 1152 07/14/16 0733 07/15/16 0701  NA 132* 136 140  K 2.9* 3.0* 4.0  CL 97* 105 109  CO2 27 24 26   GLUCOSE 126* 167* 99  BUN 8 9 9   CREATININE 0.68 0.58 0.52  CALCIUM 8.1* 7.7* 8.0*  MG  --  1.4* 1.7   GFR: Estimated Creatinine Clearance: 90.4 mL/min (by C-G formula based on SCr of 0.52 mg/dL). Liver Function Tests:  Recent Labs Lab 07/13/16 1152 07/14/16 0733  AST 35 28  ALT 29 25  ALKPHOS 50 44  BILITOT 0.6 0.4  PROT 6.8 5.5*  ALBUMIN 3.1* 2.5*   No results for input(s): LIPASE, AMYLASE in the last 168 hours. No results for input(s): AMMONIA in the last 168 hours. Coagulation Profile: No results for input(s): INR, PROTIME in the last 168 hours. Cardiac Enzymes: No results for input(s): CKTOTAL, CKMB, CKMBINDEX, TROPONINI in the last 168 hours. BNP (last 3 results) No results for input(s): PROBNP in the last 8760 hours. HbA1C: No results for input(s): HGBA1C in the last 72 hours. CBG: No results for input(s): GLUCAP in the last 168 hours. Lipid Profile: No results for input(s): CHOL, HDL, LDLCALC, TRIG, CHOLHDL, LDLDIRECT in the last 72 hours. Thyroid Function Tests: No results for input(s): TSH, T4TOTAL, FREET4, T3FREE, THYROIDAB in the last 72 hours. Anemia Panel: No results for input(s): VITAMINB12, FOLATE, FERRITIN, TIBC, IRON, RETICCTPCT in the last 72 hours. Urine analysis:    Component Value Date/Time   COLORURINE YELLOW 07/13/2016 1340   APPEARANCEUR HAZY (A) 07/13/2016 1340   LABSPEC 1.002 (L) 07/13/2016 1340   PHURINE 6.0 07/13/2016 1340   GLUCOSEU NEGATIVE 07/13/2016 1340   HGBUR SMALL (A) 07/13/2016 1340   BILIRUBINUR NEGATIVE 07/13/2016 1340   KETONESUR NEGATIVE 07/13/2016 1340   PROTEINUR NEGATIVE 07/13/2016 1340   NITRITE NEGATIVE 07/13/2016 1340   LEUKOCYTESUR LARGE (A) 07/13/2016 1340   Sepsis Labs: @LABRCNTIP (procalcitonin:4,lacticidven:4) ) Recent Results (from the past 240 hour(s))  Blood Culture (routine x 2)     Status:  None (Preliminary result)   Collection Time: 07/13/16 11:52 AM  Result Value Ref Range Status   Specimen Description BLOOD LEFT ARM  Final   Special Requests   Final    BOTTLES DRAWN AEROBIC AND ANAEROBIC Blood Culture results may not be optimal due to an inadequate volume of blood received in culture bottles   Culture   Final    NO GROWTH 2 DAYS Performed at [facility], [address], [place], [place] [address]  Report Status PENDING  Incomplete  Blood Culture (routine x 2)     Status: None (Preliminary result)   Collection Time: 07/13/16 12:19 PM  Result Value Ref Range Status   Specimen Description BLOOD LEFT ARM  Final   Special Requests   Final    BOTTLES DRAWN AEROBIC AND ANAEROBIC Blood Culture adequate volume   Culture   Final    NO GROWTH 2 DAYS Performed at [facility], [address] [place], [place] [address]    Report Status PENDING  Incomplete  Culture, Urine     Status: Abnormal (Preliminary result)   Collection Time: 07/13/16  1:40 PM  Result Value Ref Range Status   Specimen Description URINE, RANDOM  Final   Special Requests NONE  Final   Culture (A)  Final    >=100,000 COLONIES/mL ESCHERICHIA COLI SUSCEPTIBILITIES TO FOLLOW Performed at [facility], [address] [place], [place] [address]    Report Status PENDING  Incomplete         Radiology Studies: Ct Abdomen Pelvis W Wo Contrast  Result Date: 07/14/2016 CLINICAL DATA:  Left flank pain, leukocytosis and urinary tract infection. Evaluate for hydronephrosis. History of tubal ligation. EXAM: CT ABDOMEN AND PELVIS WITHOUT AND WITH CONTRAST TECHNIQUE: Multidetector CT imaging of the abdomen and pelvis was performed following the standard protocol before and following the bolus administration of intravenous contrast. CONTRAST:  100mL ISOVUE-300 IOPAMIDOL (ISOVUE-300) INJECTION 61% COMPARISON:  None. FINDINGS: Lower chest: Trace pleural effusions and mild bibasilar atelectasis. The  heart size is normal. No significant pericardial effusion. Hepatobiliary: The liver demonstrates mild periportal edema, but no focal lesion or abnormal enhancement. The gallbladder is incompletely distended with mild wall thickening and prominent surrounding edema. No significant biliary dilatation. No evidence of choledocholithiasis. Pancreas: Unremarkable. No pancreatic ductal dilatation or surrounding inflammatory changes. Spleen: Normal in size without focal abnormality. Adrenals/Urinary Tract: Both adrenal glands appear normal. Pre contrast images demonstrate no urinary tract calculi. The right kidney is located in the right false pelvis and is mildly externally rotated. There is asymmetric perinephric soft tissue stranding on the right. Following contrast, the right kidney demonstrates heterogeneous enhancement with a large area of decreased enhancement posteriorly in the upper pole, most consistent with pyelonephritis. There is no focal perinephric fluid collection or significant delay in contrast excretion. There is a smaller area of decreased enhancement medially in the upper pole of the left kidney. There are small bilateral renal cysts. No bladder abnormalities are apparent. Stomach/Bowel: No evidence of bowel wall thickening, distention or surrounding inflammatory change. The appendix appears normal. Vascular/Lymphatic: There are no enlarged abdominal or pelvic lymph nodes. Small lymph nodes in the porta hepatis, likely reactive. No significant vascular findings. Reproductive: The uterus and adnexa appear unremarkable. There is a moderate amount of free pelvic fluid which is within physiologic limits. Other: The anterior abdominal wall appears normal. There is no generalized ascites, free air or focal extraluminal fluid collection. Musculoskeletal: No acute or significant osseous findings. IMPRESSION: 1. Findings are consistent with bilateral pyelonephritis, worse on the right. There is some perinephric  soft tissue stranding on the right. No evidence of hydronephrosis, ureteral calculus or significant delay in contrast excretion. 2. Nonspecific periportal and pericholecystic edema, possibly related to hypoalbuminemia. No focal fluid collection. 3. Small bilateral renal cysts. 4. Trace pleural effusions and mild pelvic ascites. Electronically Signed   By: Carey BullocksWilliam  Veazey M.D.   On: 07/14/2016 16:36      Scheduled Meds: .  chlorhexidine  15 mL Mouth Rinse BID  . enoxaparin (LOVENOX) injection  40 mg Subcutaneous Q24H  . feeding supplement (ENSURE ENLIVE)  237 mL Oral BID BM  . mouth rinse  15 mL Mouth Rinse q12n4p   Continuous Infusions: . sodium chloride 100 mL/hr at 07/15/16 0517  . ceFEPime (MAXIPIME) IV Stopped (07/15/16 0547)     LOS: 0 days    Time spent in minutes: 35    Calvert Cantor, MD [facility] Pager: [email or web address] Password [email or web address] 07/15/2016, 3:08 PM

## 2016-07-16 DIAGNOSIS — A4151 Sepsis due to Escherichia coli [E. coli]: Secondary | ICD-10-CM

## 2016-07-16 DIAGNOSIS — N12 Tubulo-interstitial nephritis, not specified as acute or chronic: Secondary | ICD-10-CM | POA: Diagnosis not present

## 2016-07-16 DIAGNOSIS — Z1612 Extended spectrum beta lactamase (ESBL) resistance: Secondary | ICD-10-CM

## 2016-07-16 DIAGNOSIS — R109 Unspecified abdominal pain: Secondary | ICD-10-CM | POA: Diagnosis not present

## 2016-07-16 DIAGNOSIS — E876 Hypokalemia: Secondary | ICD-10-CM | POA: Diagnosis not present

## 2016-07-16 DIAGNOSIS — A498 Other bacterial infections of unspecified site: Secondary | ICD-10-CM

## 2016-07-16 DIAGNOSIS — F1510 Other stimulant abuse, uncomplicated: Secondary | ICD-10-CM

## 2016-07-16 LAB — URINE CULTURE: Culture: >=100000 — AB

## 2016-07-16 MED ORDER — FOSFOMYCIN TROMETHAMINE 3 G PO PACK
3.0000 g | PACK | Freq: Once | ORAL | Status: AC
Start: 2016-07-16 — End: 2016-07-16
  Administered 2016-07-16: 3 g via ORAL
  Filled 2016-07-16: qty 3

## 2016-07-16 MED ORDER — FOSFOMYCIN TROMETHAMINE 3 G PO PACK
3.0000 g | PACK | ORAL | 0 refills | Status: DC
Start: 2016-07-19 — End: 2020-07-02

## 2016-07-16 MED ORDER — ACETAMINOPHEN 325 MG PO TABS: 650.0000 mg | ORAL_TABLET | ORAL | Status: DC | PRN

## 2016-07-16 NOTE — Discharge Summary (Addendum)
Physician Discharge Summary  Christine Mcguire ZOX:[id number] DOB: Apr 27, 1985 DOA: 07/13/2016  PCP: Patient, No Pcp Per  Admit date: 07/13/2016 Discharge date: 07/16/2016  Admitted From: home Disposition:  home   Recommendations for Outpatient Follow-up:  1. Has been given information to [facility]   Discharge Condition:  stable   CODE STATUS:  Full code   Consultations:  none    Discharge Diagnoses:  Principal Problem:   Pyelonephritis Active Problems:   Hypokalemia   Hypomagnesemia   Leukocytosis    Subjective: No further flank pain. Urine is clear and there is no dysuria.   Brief Summary: Christine Mcguire a 31 y.o.femalewith medical history significant of drug abuse, presents to the emergency room from an urgent care center with 3 days of flank pain on the right side, burning with urination, dark urine as well as subjective fever and chills. Her last UTI was 1 month ago. She has right flank pain for the past 2 yrs but states she has not had a work up for it. The pain is much worse now. Also complains of cough and chest congestion. Not coughing up mucous. No dyspnea. No sore throat. Nauseated but tolerating solid food and eating and drinking fine.   Hospital Course:  Principal Problem:   Pyelonephritis- e coli, ESBL  Leukocytosis, fever >> sepsis - treated with Cefepime in the hospital - controled pain with Toradol and Tylenol - max temp -100.6  - second UTI in past few months - severely tender in RLQ- obtained CT abd/pelvis to look for nephrolithiasis, abscess and other pathology as her pain is chronic as well- CT consistent with b/l Pyelonephritis Right > Left- she is not tender on left and doubtful she has left sided pyelo - blood cultures negative - U culture >> E coli- ESBL- sensitive to Zosyn, Imipenem and Nitrofurantoin- sensitivities to Cefepime not mentioned but her symptoms have resolved  - have spoken with ID about plan (she  has no insurance) - recommended Fosfomycin 1 packet every 3 days x 3 doses- given first dose here- checked with case manager to ensure she is able to afford the other 2 doses - she states she will be able to buy them both - advised not to miss any doses and return if symptoms recur  Active Problems:   Hypokalemia   Hypomagnesemia   Hyponatremia - replaced  Hypotension- 90/50 - given 2 L NS in ER and on continuous fluilds- BP Improved  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 - counseled   Anemia - outpt work up   Discharge Instructions  Discharge Instructions    Diet - low sodium heart healthy    Complete by:  As directed    Increase activity slowly    Complete by:  As directed      Allergies as of 07/16/2016   No Known Allergies     Medication List    TAKE these medications   acetaminophen 325 MG tablet Commonly known as:  TYLENOL Take 2 tablets (650 mg total) by mouth every 4 (four) hours as needed for mild pain (or Fever >/= 101).   fosfomycin 3 g Pack Commonly known as:  MONUROL Take 3 g by mouth every 3 (three) days. Start taking on:  07/19/2016   ibuprofen 200 MG tablet Commonly known as:  ADVIL,MOTRIN Take 400 mg by mouth every 6 (six) hours as needed.       No Known Allergies  Procedures/Studies:    Ct Abdomen Pelvis W Wo Contrast  Result Date: 07/14/2016 CLINICAL DATA:  Left flank pain, leukocytosis and urinary tract infection. Evaluate for hydronephrosis. History of tubal ligation. EXAM: CT ABDOMEN AND PELVIS WITHOUT AND WITH CONTRAST TECHNIQUE: Multidetector CT imaging of the abdomen and pelvis was performed following the standard protocol before and following the bolus administration of intravenous contrast. CONTRAST:  ISOVUE-300 IOPAMIDOL (ISOVUE-300) INJECTION 61% COMPARISON:  None. FINDINGS: Lower chest: Trace pleural effusions and mild bibasilar atelectasis. The heart size is normal. No significant pericardial effusion.  Hepatobiliary: The liver demonstrates mild periportal edema, but no focal lesion or abnormal enhancement. The gallbladder is incompletely distended with mild wall thickening and prominent surrounding edema. No significant biliary dilatation. No evidence of choledocholithiasis. Pancreas: Unremarkable. No pancreatic ductal dilatation or surrounding inflammatory changes. Spleen: Normal in size without focal abnormality. Adrenals/Urinary Tract: Both adrenal glands appear normal. Pre contrast images demonstrate no urinary tract calculi. The right kidney is located in the right false pelvis and is mildly externally rotated. There is asymmetric perinephric soft tissue stranding on the right. Following contrast, the right kidney demonstrates heterogeneous enhancement with a large area of decreased enhancement posteriorly in the upper pole, most consistent with pyelonephritis. There is no focal perinephric fluid collection or significant delay in contrast excretion. There is a smaller area of decreased enhancement medially in the upper pole of the left kidney. There are small bilateral renal cysts. No bladder abnormalities are apparent. Stomach/Bowel: No evidence of bowel wall thickening, distention or surrounding inflammatory change. The appendix appears normal. Vascular/Lymphatic: There are no enlarged abdominal or pelvic lymph nodes. Small lymph nodes in the porta hepatis, likely reactive. No significant vascular findings. Reproductive: The uterus and adnexa appear unremarkable. There is a moderate amount of free pelvic fluid which is within physiologic limits. Other: The anterior abdominal wall appears normal. There is no generalized ascites, free air or focal extraluminal fluid collection. Musculoskeletal: No acute or significant osseous findings. IMPRESSION: 1. Findings are consistent with bilateral pyelonephritis, worse on the right. There is some perinephric soft tissue stranding on the right. No evidence of  hydronephrosis, ureteral calculus or significant delay in contrast excretion. 2. Nonspecific periportal and pericholecystic edema, possibly related to hypoalbuminemia. No focal fluid collection. 3. Small bilateral renal cysts. 4. Trace pleural effusions and mild pelvic ascites. Electronically Signed   By: Carey Bullocks M.D.   On: 07/14/2016 16:36        Discharge Exam: Vitals:   07/15/16 2047 07/16/16 0611  BP: (!) 125/59 133/72  Pulse: 98 83  Resp:  18  Temp: 99 F (37.2 C) 98.4 F (36.9 C)   Vitals:   07/15/16 0513 07/15/16 1316 07/15/16 2047 07/16/16 0611  BP: (!) 116/54 (!) 124/59 (!) 125/59 133/72  Pulse: (!) 112 85 98 83  Resp: 18 18  18   Temp: 98.3 F (36.8 C) 98.4 F (36.9 C) 99 F (37.2 C) 98.4 F (36.9 C)  TempSrc: Oral Oral Oral Oral  SpO2: 100% 100% 100% 100%  Weight:      Height:        General: Pt is alert, awake, not in acute distress Cardiovascular: RRR, S1/S2 +, no rubs, no gallops Respiratory: CTA bilaterally, no wheezing, no rhonchi Abdominal: Soft, NT, ND, bowel sounds + Extremities: no edema, no cyanosis    The results of significant diagnostics from this hospitalization (including imaging, microbiology, ancillary and laboratory) are listed below for reference.     Microbiology: Recent Results (from  the past 240 hour(s))  Blood Culture (routine x 2)     Status: None (Preliminary result)   Collection Time: 07/13/16 11:52 AM  Result Value Ref Range Status   Specimen Description BLOOD LEFT ARM  Final   Special Requests   Final    BOTTLES DRAWN AEROBIC AND ANAEROBIC Blood Culture results may not be optimal due to an inadequate volume of blood received in culture bottles   Culture   Final    NO GROWTH 2 DAYS Performed at [facility], [address], [place], [place] [address]    Report Status PENDING  Incomplete  Blood Culture (routine x 2)     Status: None (Preliminary result)   Collection Time: 07/13/16 12:19 PM  Result Value Ref  Range Status   Specimen Description BLOOD LEFT ARM  Final   Special Requests   Final    BOTTLES DRAWN AEROBIC AND ANAEROBIC Blood Culture adequate volume   Culture   Final    NO GROWTH 2 DAYS Performed at [facility], [address], [place], [place] [address]    Report Status PENDING  Incomplete  Culture, Urine     Status: Abnormal   Collection Time: 07/13/16  1:40 PM  Result Value Ref Range Status   Specimen Description URINE, RANDOM  Final   Special Requests NONE  Final   Culture (A)  Final    >=100,000 COLONIES/mL ESCHERICHIA COLI Confirmed Extended Spectrum Beta-Lactamase Producer (ESBL) Performed at [facility], [address] [place], [place] [address]    Report Status 07/16/2016 FINAL  Final   Organism ID, Bacteria ESCHERICHIA COLI (A)  Final      Susceptibility   Escherichia coli - MIC*    AMPICILLIN >=32 RESISTANT Resistant     CEFAZOLIN >=64 RESISTANT Resistant     CEFTRIAXONE >=64 RESISTANT Resistant     CIPROFLOXACIN >=4 RESISTANT Resistant     GENTAMICIN >=16 RESISTANT Resistant     IMIPENEM <=0.25 SENSITIVE Sensitive     NITROFURANTOIN <=16 SENSITIVE Sensitive     TRIMETH/SULFA >=320 RESISTANT Resistant     AMPICILLIN/SULBACTAM >=32 RESISTANT Resistant     PIP/TAZO <=4 SENSITIVE Sensitive     Extended ESBL POSITIVE Resistant     * >=100,000 COLONIES/mL ESCHERICHIA COLI     Labs: BNP (last 3 results) No results for input(s): BNP in the last 8760 hours. Basic Metabolic Panel:  Recent Labs Lab 07/13/16 1152 07/14/16 0733 07/15/16 0701  NA 132* 136 140  K 2.9* 3.0* 4.0  CL 97* 105 109  CO2 27 24 26   GLUCOSE 126* 167* 99  BUN 8 9 9   CREATININE 0.68 0.58 2.95  CALCIUM 8.1* 7.7* 8.0*  MG  --  1.4* 1.7   Liver Function Tests:  Recent Labs Lab 07/13/16 1152 07/14/16 0733  AST 35 28  ALT 29 25  ALKPHOS 50 44  BILITOT 0.6 0.4  PROT 6.8 5.5*  ALBUMIN 3.1* 2.5*   No results for input(s): LIPASE, AMYLASE in the last 168 hours. No  results for input(s): AMMONIA in the last 168 hours. CBC:  Recent Labs Lab 07/13/16 1152 07/14/16 0733 07/15/16 0701  WBC 14.0* 11.0* 10.0  NEUTROABS 10.8*  --   --   HGB 10.7* 10.5* 9.8*  HCT 31.7* 31.3* 29.4*  MCV 85.4 85.8 86.2  PLT 244 182 209   Cardiac Enzymes: No results for input(s): CKTOTAL, CKMB, CKMBINDEX, TROPONINI in the last 168 hours. BNP: Invalid input(s): POCBNP  CBG: No results for input(s): GLUCAP in the last 168 hours. D-Dimer No results for input(s): DDIMER in the last 72 hours. Hgb A1c No results for input(s): HGBA1C in the last 72 hours. Lipid Profile No results for input(s): CHOL, HDL, LDLCALC, TRIG, CHOLHDL, LDLDIRECT in the last 72 hours. Thyroid function studies No results for input(s): TSH, T4TOTAL, T3FREE, THYROIDAB in the last 72 hours.  Invalid input(s): FREET3 Anemia work up No results for input(s): VITAMINB12, FOLATE, FERRITIN, TIBC, IRON, RETICCTPCT in the last 72 hours. Urinalysis    Component Value Date/Time   COLORURINE YELLOW 07/13/2016 1340   APPEARANCEUR HAZY (A) 07/13/2016 1340   LABSPEC 1.002 (L) 07/13/2016 1340   PHURINE 6.0 07/13/2016 1340   GLUCOSEU NEGATIVE 07/13/2016 1340   HGBUR SMALL (A) 07/13/2016 1340   BILIRUBINUR NEGATIVE 07/13/2016 1340   KETONESUR NEGATIVE 07/13/2016 1340   PROTEINUR NEGATIVE 07/13/2016 1340   NITRITE NEGATIVE 07/13/2016 1340   LEUKOCYTESUR LARGE (A) 07/13/2016 1340   Sepsis Labs Invalid input(s): PROCALCITONIN,  WBC,  LACTICIDVEN Microbiology Recent Results (from the past 240 hour(s))  Blood Culture (routine x 2)     Status: None (Preliminary result)   Collection Time: 07/13/16 11:52 AM  Result Value Ref Range Status   Specimen Description BLOOD LEFT ARM  Final   Special Requests   Final    BOTTLES DRAWN AEROBIC AND ANAEROBIC Blood Culture results may not be optimal due to an inadequate volume of blood received in culture bottles   Culture   Final    NO GROWTH 2 DAYS Performed at [facility], [address], [place], [place] [address]    Report Status PENDING  Incomplete  Blood Culture (routine x 2)     Status: None (Preliminary result)   Collection Time: 07/13/16 12:19 PM  Result Value Ref Range Status   Specimen Description BLOOD LEFT ARM  Final   Special Requests   Final    BOTTLES DRAWN AEROBIC AND ANAEROBIC Blood Culture adequate volume   Culture   Final    NO GROWTH 2 DAYS Performed at [facility], [address] [place], [place] [address]    Report Status PENDING  Incomplete  Culture, Urine     Status: Abnormal   Collection Time: 07/13/16  1:40 PM  Result Value Ref Range Status   Specimen Description URINE, RANDOM  Final   Special Requests NONE  Final   Culture (A)  Final    >=100,000 COLONIES/mL ESCHERICHIA COLI Confirmed Extended Spectrum Beta-Lactamase Producer (ESBL) Performed at [facility], [address], [place], [place] [address]    Report Status 07/16/2016 FINAL  Final   Organism ID, Bacteria ESCHERICHIA COLI (A)  Final      Susceptibility   Escherichia coli - MIC*    AMPICILLIN >=32 RESISTANT Resistant     CEFAZOLIN >=64 RESISTANT Resistant     CEFTRIAXONE >=64 RESISTANT Resistant     CIPROFLOXACIN >=4 RESISTANT Resistant     GENTAMICIN >=16 RESISTANT Resistant     IMIPENEM <=0.25 SENSITIVE Sensitive     NITROFURANTOIN <=16 SENSITIVE Sensitive     TRIMETH/SULFA >=320 RESISTANT Resistant     AMPICILLIN/SULBACTAM >=32 RESISTANT Resistant     PIP/TAZO <=4 SENSITIVE Sensitive     Extended ESBL POSITIVE Resistant     * >=100,000 COLONIES/mL ESCHERICHIA COLI     Time coordinating discharge: Over 30 minutes  SIGNED:   Calvert Cantor, MD  [facility] 07/16/2016, 8:44 AM Pager  If 7PM-7AM, please contact night-coverage [email or web address] Password [email or web address]

## 2016-07-16 NOTE — Discharge Instructions (Signed)
You have an ESBL producing E coli pyelonephritis. Please remember to tell this to the next person who treats you for a UTI as routine antibiotics will not treat this kind of organism.   Please take all your medications with you for your next visit with your Primary MD. Please request your Primary MD to go over all hospital test results at the follow up. Please ask your Primary MD to get all Hospital records sent to his/her office.  If you experience worsening of your admission symptoms, develop shortness of breath, chest pain, suicidal or homicidal thoughts or a life threatening emergency, you must seek medical attention immediately by calling 911 or calling your MD.  Christine QuinYou must read the complete instructions/literature along with all the possible adverse reactions/side effects for all the medicines you take including new medications that have been prescribed to you. Take new medicines after you have completely understood and accpet all the possible adverse reactions/side effects.   Do not drive when taking pain medications or sedatives.    Do not take more than prescribed Pain, Sleep and Anxiety Medications  If you have smoked or chewed Tobacco in the last 2 yrs please stop. Stop any regular alcohol and or recreational drug use.  Wear Seat belts while driving.

## 2016-07-16 NOTE — Care Management Note (Signed)
Case Management Note  Patient Details  Name: Christine CrewsCasey Mcguire MRN: [id number] Date of Birth: 1985-06-24  Subjective/Objective:                  31 y.o. female with medical history significant of drug abuse, presents to the emergency room from an urgent care center with 3 days of flank pain on the right side, burning with urination, dark urine as well as subjective fever and chills.   Pyelonephritis    Action/Plan: Spoke with patient, advised of approximate cost of fosamycin per dose $89 - 100 per dose, per MD patient will need 2 doses,  states she can afford medications, given goodrx discount coupon for [facility] ($89.23), and [facility].   Patient voices understanding, states she will obtain medications from local pharmacy,  [facility] to obtain follow up appointment, and is very appreciative of follow up information.   Patient states she lost her Medicaid when she lost custody of children, and is in the process of working on getting Medicaid back.  Patient has no other CM needs at this time, Dr. Butler Denmarkizwan, and patient's nurse Rosey Bath(Teresa) updated on above conversation with patient.    Expected Discharge Date:  07/16/16               Expected Discharge Plan:  Home/Self Care  In-House Referral:     Discharge planning Services     Post Acute Care Choice:    Choice offered to:     DME Arranged:    DME Agency:     HH Arranged:    HH Agency:     Status of Service:  Completed, signed off  If discussed at [employer] Length of Stay Meetings, dates discussed:    Additional Comments:  Christine Mcguire Christine Garciaperez, RN 07/16/2016, 11:17 AM

## 2016-07-16 NOTE — Progress Notes (Signed)
Patient discharged home.  Leaving with personal belongings and prescription.  Patient has coupon for medication.  Reports understanding of discharge instructions.  Room air, denies pain, no s/s of distress.  Mother picking up patient at discharge.  No complaints.

## 2016-07-18 LAB — CULTURE, BLOOD (ROUTINE X 2)
Culture: NO GROWTH
Culture: NO GROWTH
SPECIAL REQUESTS: ADEQUATE
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
## 2018-03-08 DIAGNOSIS — J028 Acute pharyngitis due to other specified organisms: Secondary | ICD-10-CM | POA: Diagnosis not present

## 2018-08-30 DIAGNOSIS — N390 Urinary tract infection, site not specified: Secondary | ICD-10-CM | POA: Diagnosis not present

## 2018-08-30 DIAGNOSIS — M545 Low back pain: Secondary | ICD-10-CM | POA: Diagnosis not present

## 2018-08-30 DIAGNOSIS — F17210 Nicotine dependence, cigarettes, uncomplicated: Secondary | ICD-10-CM | POA: Diagnosis not present

## 2018-08-30 DIAGNOSIS — R319 Hematuria, unspecified: Secondary | ICD-10-CM | POA: Diagnosis not present

## 2018-09-29 DIAGNOSIS — R110 Nausea: Secondary | ICD-10-CM | POA: Diagnosis not present

## 2018-09-29 DIAGNOSIS — R001 Bradycardia, unspecified: Secondary | ICD-10-CM | POA: Diagnosis not present

## 2018-09-29 DIAGNOSIS — T402X1A Poisoning by other opioids, accidental (unintentional), initial encounter: Secondary | ICD-10-CM | POA: Diagnosis not present

## 2018-09-29 DIAGNOSIS — R079 Chest pain, unspecified: Secondary | ICD-10-CM | POA: Diagnosis not present

## 2018-09-29 DIAGNOSIS — R4020 Unspecified coma: Secondary | ICD-10-CM | POA: Diagnosis not present

## 2018-09-29 DIAGNOSIS — R404 Transient alteration of awareness: Secondary | ICD-10-CM | POA: Diagnosis not present

## 2018-09-29 DIAGNOSIS — T401X1A Poisoning by heroin, accidental (unintentional), initial encounter: Secondary | ICD-10-CM | POA: Diagnosis not present

## 2018-10-14 IMAGING — CT CT ABD-PEL WO/W CM
3 of 8 series · 15 of 46 positions shown, 17 images · IV contrast (iopamidol)
Comparison: None.

CLINICAL DATA: Left flank pain, leukocytosis and urinary tract
infection. Evaluate for hydronephrosis. History of tubal ligation.

EXAM:
CT ABDOMEN AND PELVIS WITHOUT AND WITH CONTRAST
TECHNIQUE: Multidetector CT imaging of the abdomen and pelvis was performed
following the standard protocol before and following the bolus
administration of intravenous contrast.
CONTRAST:  100mL 8ED7DG-CTT IOPAMIDOL (8ED7DG-CTT) INJECTION 61%

[Series 2: axial st · axial · 0.68mm/px · z∈[-548,-158]mm · 9 of 98 slices shown, 11 images (1 of 2)]
[im 10/98  soft-tissue]
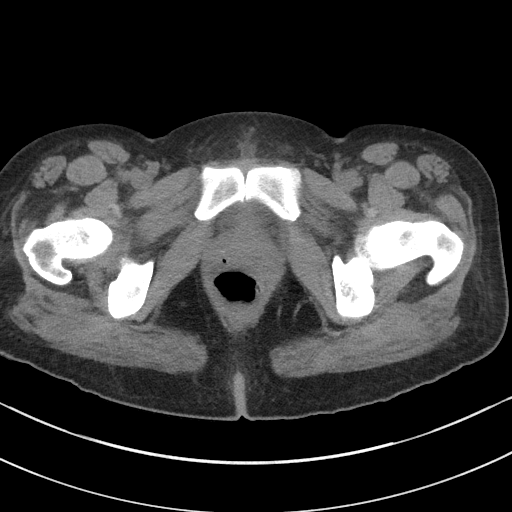
[im 10/98  bone]
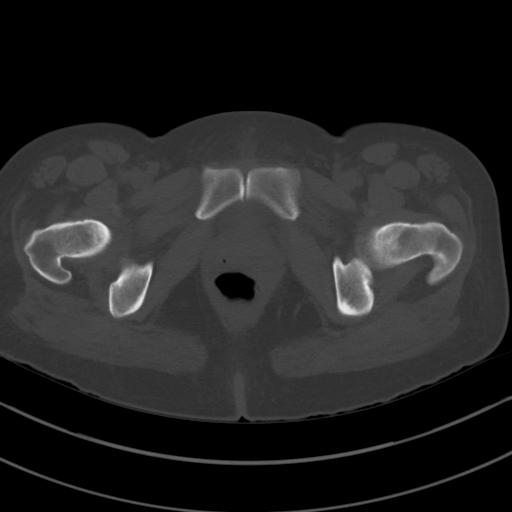
[im 20/98  soft-tissue]
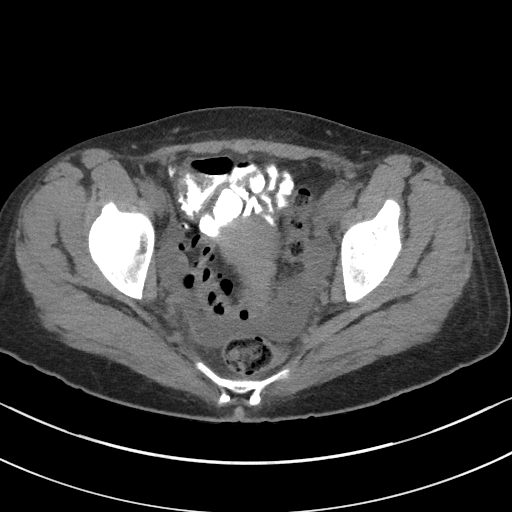
[im 30/98  soft-tissue]
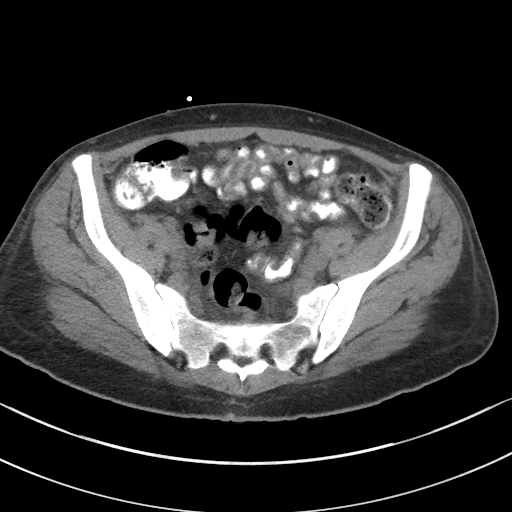
[im 39/98  soft-tissue]
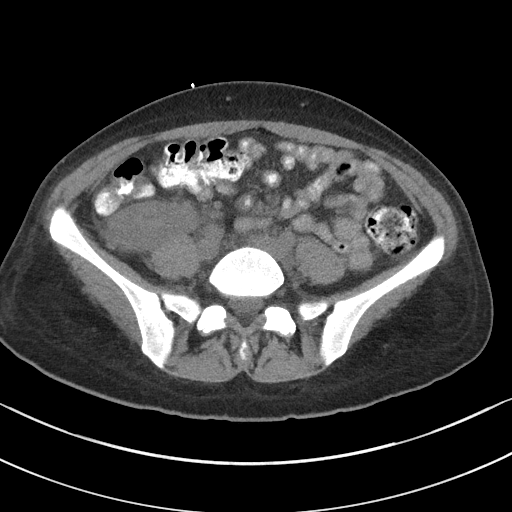
[im 49/98  soft-tissue]
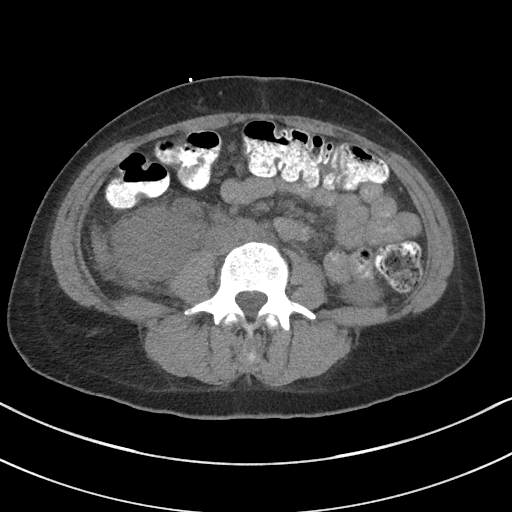
[im 59/98  soft-tissue]
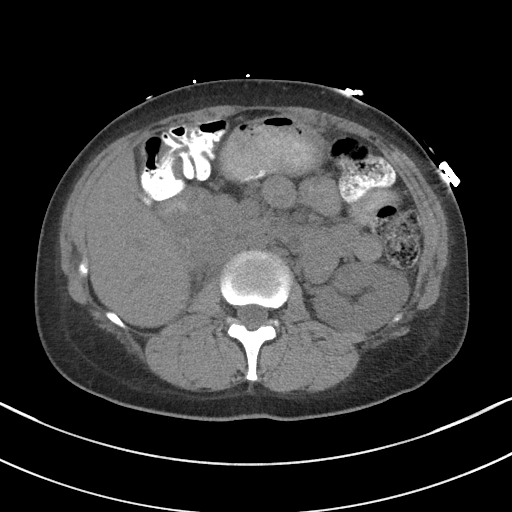
[im 68/98  soft-tissue]
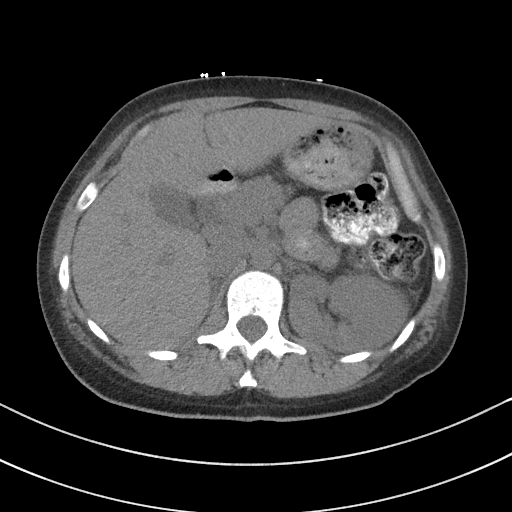
[im 78/98  soft-tissue]
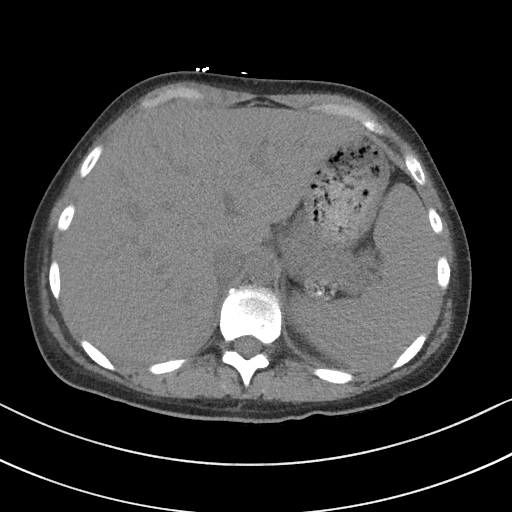
[im 88/98  soft-tissue]
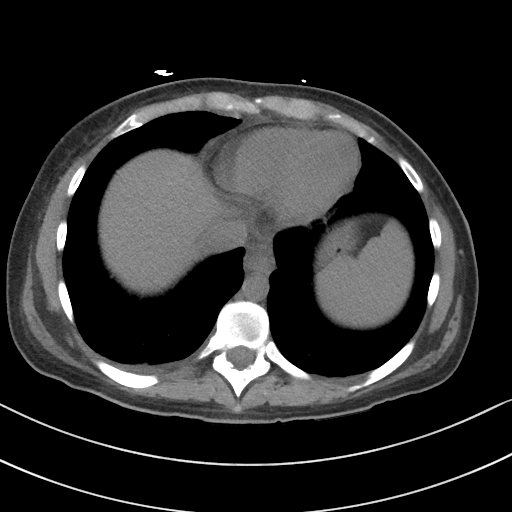
[im 88/98  bone]
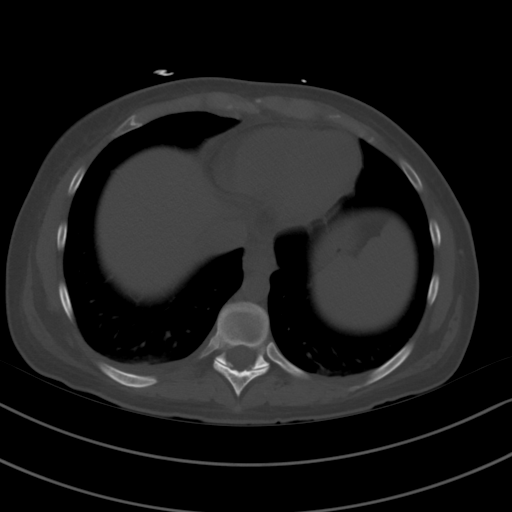

[Series 5: coronal st · coronal · 0.68mm/px · 3 of 99 slices shown]
[im 25/99  soft-tissue]
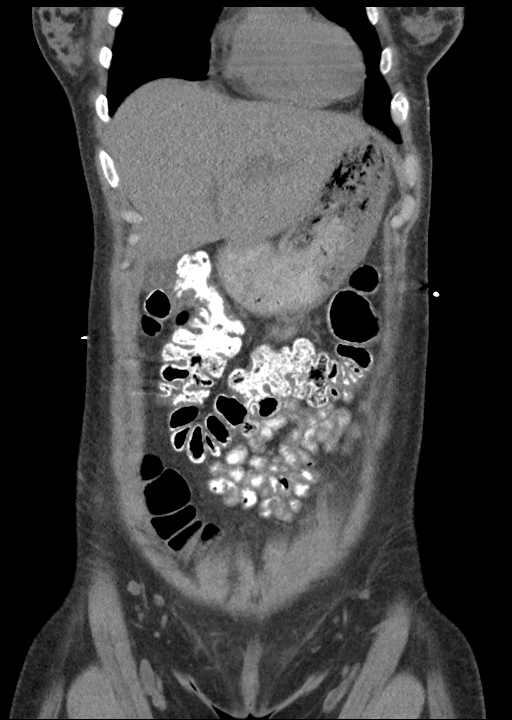
[im 50/99  soft-tissue]
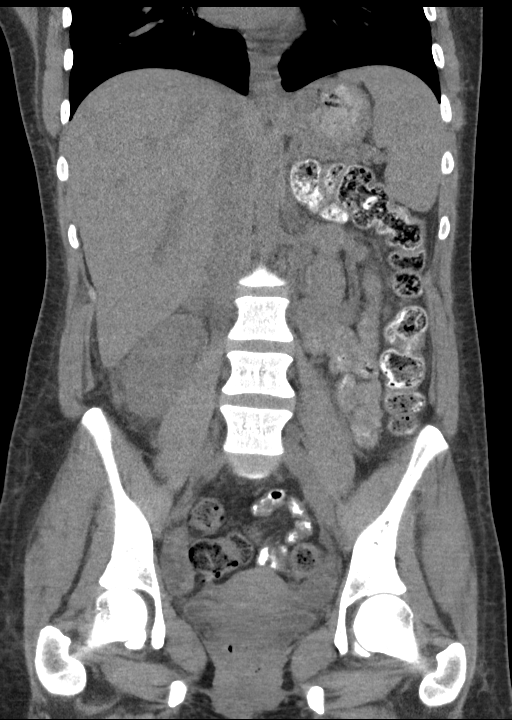
[im 74/99  soft-tissue]
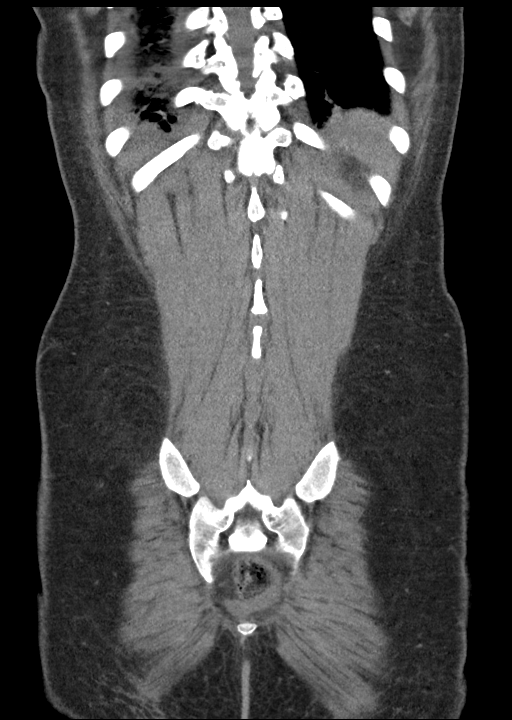

[Series 7: axial st · axial · 0.68mm/px · z∈[-548,-438]mm · 3 of 100 slices shown (2 of 2)]
[im 12/100  soft-tissue]
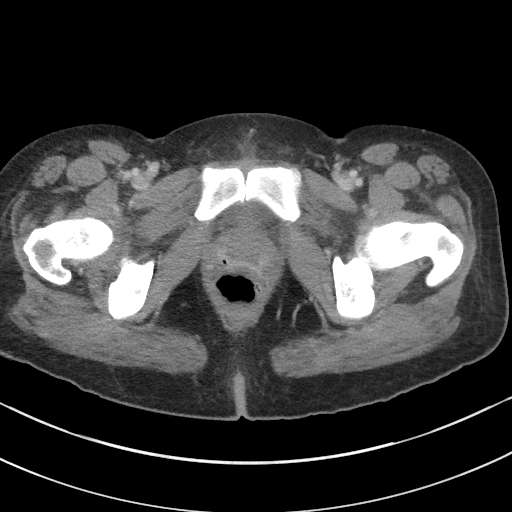
[im 23/100  soft-tissue]
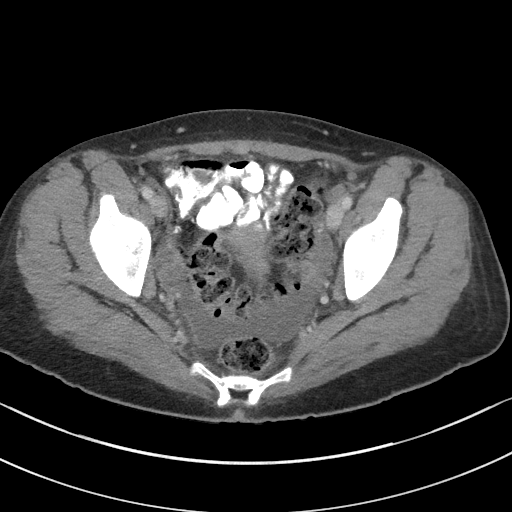
[im 34/100  soft-tissue]
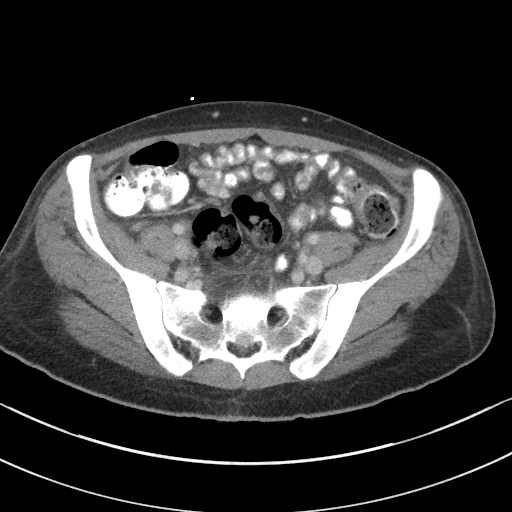

[15 of 46 positions shown; findings below may reference images not displayed]

FINDINGS: Lower chest: Trace pleural effusions and mild bibasilar atelectasis.
The heart size is normal. No significant pericardial effusion.

Hepatobiliary: The liver demonstrates mild periportal edema, but no
focal lesion or abnormal enhancement. The gallbladder is
incompletely distended with mild wall thickening and prominent
surrounding edema. No significant biliary dilatation. No evidence of
choledocholithiasis.

Pancreas: Unremarkable. No pancreatic ductal dilatation or
surrounding inflammatory changes.

Spleen: Normal in size without focal abnormality.

Adrenals/Urinary Tract: Both adrenal glands appear normal. Pre
contrast images demonstrate no urinary tract calculi. The right
kidney is located in the right false pelvis and is mildly externally
rotated. There is asymmetric perinephric soft tissue stranding on
the right. Following contrast, the right kidney demonstrates
heterogeneous enhancement with a large area of decreased enhancement
posteriorly in the upper pole, most consistent with pyelonephritis.
There is no focal perinephric fluid collection or significant delay
in contrast excretion. There is a smaller area of decreased
enhancement medially in the upper pole of the left kidney. There are
small bilateral renal cysts. No bladder abnormalities are apparent.

Stomach/Bowel: No evidence of bowel wall thickening, distention or
surrounding inflammatory change. The appendix appears normal.

Vascular/Lymphatic: There are no enlarged abdominal or pelvic lymph
nodes. Small lymph nodes in the porta hepatis, likely reactive. No
significant vascular findings.

Reproductive: The uterus and adnexa appear unremarkable. There is a
moderate amount of free pelvic fluid which is within physiologic
limits.

Other: The anterior abdominal wall appears normal. There is no
generalized ascites, free air or focal extraluminal fluid
collection.

Musculoskeletal: No acute or significant osseous findings.
IMPRESSION: 1. Findings are consistent with bilateral pyelonephritis, worse on
the right. There is some perinephric soft tissue stranding on the
right. No evidence of hydronephrosis, ureteral calculus or
significant delay in contrast excretion.
2. Nonspecific periportal and pericholecystic edema, possibly
related to hypoalbuminemia. No focal fluid collection.
3. Small bilateral renal cysts.
4. Trace pleural effusions and mild pelvic ascites.

## 2019-05-12 DIAGNOSIS — J0300 Acute streptococcal tonsillitis, unspecified: Secondary | ICD-10-CM | POA: Diagnosis not present

## 2019-05-12 DIAGNOSIS — N3091 Cystitis, unspecified with hematuria: Secondary | ICD-10-CM | POA: Diagnosis not present

## 2019-05-12 DIAGNOSIS — R509 Fever, unspecified: Secondary | ICD-10-CM | POA: Diagnosis not present

## 2019-05-12 DIAGNOSIS — N3000 Acute cystitis without hematuria: Secondary | ICD-10-CM | POA: Diagnosis not present

## 2019-05-12 DIAGNOSIS — Z20828 Contact with and (suspected) exposure to other viral communicable diseases: Secondary | ICD-10-CM | POA: Diagnosis not present

## 2019-05-20 DIAGNOSIS — L738 Other specified follicular disorders: Secondary | ICD-10-CM | POA: Diagnosis not present

## 2019-08-25 DIAGNOSIS — R079 Chest pain, unspecified: Secondary | ICD-10-CM | POA: Diagnosis not present

## 2019-08-25 DIAGNOSIS — T50904A Poisoning by unspecified drugs, medicaments and biological substances, undetermined, initial encounter: Secondary | ICD-10-CM | POA: Diagnosis not present

## 2019-08-25 DIAGNOSIS — Z8619 Personal history of other infectious and parasitic diseases: Secondary | ICD-10-CM | POA: Diagnosis not present

## 2019-08-25 DIAGNOSIS — X58XXXA Exposure to other specified factors, initial encounter: Secondary | ICD-10-CM | POA: Diagnosis not present

## 2019-08-25 DIAGNOSIS — T887XXA Unspecified adverse effect of drug or medicament, initial encounter: Secondary | ICD-10-CM | POA: Diagnosis not present

## 2019-08-25 DIAGNOSIS — R0789 Other chest pain: Secondary | ICD-10-CM | POA: Diagnosis not present

## 2019-08-25 DIAGNOSIS — N941 Unspecified dyspareunia: Secondary | ICD-10-CM | POA: Diagnosis not present

## 2019-08-25 DIAGNOSIS — T407X1A Poisoning by cannabis (derivatives), accidental (unintentional), initial encounter: Secondary | ICD-10-CM | POA: Diagnosis not present

## 2019-08-25 DIAGNOSIS — Z708 Other sex counseling: Secondary | ICD-10-CM | POA: Diagnosis not present

## 2019-09-19 DIAGNOSIS — N3001 Acute cystitis with hematuria: Secondary | ICD-10-CM | POA: Diagnosis not present

## 2019-10-04 DIAGNOSIS — L02413 Cutaneous abscess of right upper limb: Secondary | ICD-10-CM | POA: Diagnosis not present

## 2019-10-04 DIAGNOSIS — B355 Tinea imbricata: Secondary | ICD-10-CM | POA: Diagnosis not present

## 2019-10-04 DIAGNOSIS — L0291 Cutaneous abscess, unspecified: Secondary | ICD-10-CM | POA: Diagnosis not present

## 2019-10-04 DIAGNOSIS — L0231 Cutaneous abscess of buttock: Secondary | ICD-10-CM | POA: Diagnosis not present

## 2019-11-12 DIAGNOSIS — F419 Anxiety disorder, unspecified: Secondary | ICD-10-CM | POA: Diagnosis not present

## 2019-11-12 DIAGNOSIS — D72819 Decreased white blood cell count, unspecified: Secondary | ICD-10-CM | POA: Diagnosis not present

## 2019-11-12 DIAGNOSIS — F339 Major depressive disorder, recurrent, unspecified: Secondary | ICD-10-CM | POA: Diagnosis not present

## 2019-11-12 DIAGNOSIS — B182 Chronic viral hepatitis C: Secondary | ICD-10-CM | POA: Diagnosis not present

## 2019-11-12 DIAGNOSIS — N390 Urinary tract infection, site not specified: Secondary | ICD-10-CM | POA: Diagnosis not present

## 2019-11-12 DIAGNOSIS — R5382 Chronic fatigue, unspecified: Secondary | ICD-10-CM | POA: Diagnosis not present

## 2020-06-23 ENCOUNTER — Ambulatory Visit: Payer: Self-pay | Admitting: Sports Medicine

## 2020-07-02 ENCOUNTER — Ambulatory Visit (INDEPENDENT_AMBULATORY_CARE_PROVIDER_SITE_OTHER): Payer: Self-pay | Admitting: Sports Medicine

## 2020-07-02 ENCOUNTER — Ambulatory Visit (INDEPENDENT_AMBULATORY_CARE_PROVIDER_SITE_OTHER): Payer: Self-pay

## 2020-07-02 ENCOUNTER — Other Ambulatory Visit: Payer: Self-pay

## 2020-07-02 ENCOUNTER — Other Ambulatory Visit: Payer: Self-pay | Admitting: Sports Medicine

## 2020-07-02 ENCOUNTER — Encounter: Payer: Self-pay | Admitting: Sports Medicine

## 2020-07-02 DIAGNOSIS — M21612 Bunion of left foot: Secondary | ICD-10-CM

## 2020-07-02 DIAGNOSIS — M779 Enthesopathy, unspecified: Secondary | ICD-10-CM

## 2020-07-02 DIAGNOSIS — M7752 Other enthesopathy of left foot: Secondary | ICD-10-CM

## 2020-07-02 DIAGNOSIS — M21619 Bunion of unspecified foot: Secondary | ICD-10-CM

## 2020-07-02 DIAGNOSIS — M79672 Pain in left foot: Secondary | ICD-10-CM

## 2020-07-02 DIAGNOSIS — M2040 Other hammer toe(s) (acquired), unspecified foot: Secondary | ICD-10-CM

## 2020-07-02 MED ORDER — MELOXICAM 15 MG PO TABS: 15.0000 mg | ORAL_TABLET | Freq: Every day | ORAL | 0 refills | Status: AC

## 2020-07-02 NOTE — Patient Instructions (Signed)
Get OTC topical voltaren gel to rub on your bunion at bedtime Get Motrin OTC and if not effective may pick up Rx of Mobic

## 2020-07-02 NOTE — Progress Notes (Signed)
Subjective: Christine Mcguire is a 35 y.o. female patient who presents to office for evaluation of Left bunion pain. Patient complains of progressive pain especially over the last year in the Left foot that starts as pain over the bump with direct pressure and range of motion; patient now has difficulty fitting shoes comfortably. Ranks pain 8/10 and is now interferring with daily activities.  Patient has also tried cushions. Patient denies any other pedal complaints.   Patient Active Problem List   Diagnosis Date Noted  . Infection due to ESBL-producing Escherichia coli 07/16/2016  . Hypokalemia 07/14/2016  . Hypomagnesemia 07/14/2016  . Leukocytosis 07/14/2016  . Pyelonephritis 07/13/2016    No current outpatient medications on file prior to visit.   No current facility-administered medications on file prior to visit.    No Known Allergies  Objective:  General: Alert and oriented x3 in no acute distress  Dermatology: No open lesions bilateral lower extremities, no webspace macerations, no ecchymosis bilateral, all nails x 10 are well manicured.  Vascular: Dorsalis Pedis and Posterior Tibial pedal pulses 2/4, Capillary Fill Time 3 seconds, (+) pedal hair growth bilateral, no edema bilateral lower extremities, Temperature gradient within normal limits.  Neurology: Gross sensation intact via light touch bilateral.   Musculoskeletal: Mild tenderness with palpation left bunion deformity, + hammertoe, no limitation or crepitus with range of motion, deformity reducible, tracking not trackbound, there is no 1st ray hypermobility noted bilateral. Midtarsal, Subtalar joint, and ankle joint range of motion is within normal limits. On weightbearing exam, there is decreased 1st MTPJ rom Left with functional limitus noted, there is medial arch collapse Left>Right on weightbearing, rearfoot slight valgus, forefoot slight abduction with HAV deformity supported on ground with no second toe crossover  deformity noted.    Xrays  Left Foot    Impression: Intermetatarsal angle above normal limits and contracture of toes      Assessment and Plan: Problem List Items Addressed This Visit   None   Visit Diagnoses    Bunion    -  Primary   Left foot pain       Hammer toe, unspecified laterality       Capsulitis           -Complete examination performed -Xrays reviewed -Discussed treatement options; discussed HAV deformity;conservative and  Surgical management; risks, benefits, alternatives discussed. All patient's questions answered. -Dispensed bunion shield -Rx Mobic -Recommend continue with good supportive shoes and inserts.  -Patient to return to office as needed or sooner if condition worsens. Advised patient may benefit from surgery but should try to get insurance.  Asencion Islam, DPM

## 2020-07-05 ENCOUNTER — Other Ambulatory Visit: Payer: Self-pay | Admitting: Sports Medicine

## 2020-07-05 DIAGNOSIS — M21619 Bunion of unspecified foot: Secondary | ICD-10-CM

## 2020-07-05 DIAGNOSIS — M779 Enthesopathy, unspecified: Secondary | ICD-10-CM

## 2021-10-20 ENCOUNTER — Emergency Department (HOSPITAL_COMMUNITY)
Admission: EM | Admit: 2021-10-20 | Discharge: 2021-10-20 | Discharge status: Against Medical Advice | Payer: Commercial Managed Care - HMO | Attending: Emergency Medicine | Admitting: Emergency Medicine

## 2021-10-20 ENCOUNTER — Encounter (HOSPITAL_COMMUNITY): Payer: Self-pay

## 2021-10-20 ENCOUNTER — Other Ambulatory Visit: Payer: Self-pay

## 2021-10-20 DIAGNOSIS — M545 Low back pain, unspecified: Secondary | ICD-10-CM | POA: Insufficient documentation

## 2021-10-20 DIAGNOSIS — R109 Unspecified abdominal pain: Secondary | ICD-10-CM | POA: Diagnosis present

## 2021-10-20 DIAGNOSIS — Z5321 Procedure and treatment not carried out due to patient leaving prior to being seen by health care provider: Secondary | ICD-10-CM | POA: Diagnosis not present

## 2021-10-20 NOTE — ED Notes (Signed)
Attempted to get blood work done. Patient asked for a stress ball but writer mentioned that we don't have any. Patient stated she doesn't think she can get blood work done and decided to leave. Patient was seen to walking out the waiting room.

## 2021-10-20 NOTE — ED Triage Notes (Signed)
Pt reports with lower back pain and reports having a kidney infection. Pt seems to be impaired (drug related). Pt's visitor nodding off in chair.

## 2021-10-20 NOTE — ED Notes (Deleted)
Pt reports with lower back pain and reports having a kidney infection. Pt seems to be impaired (drug related). Pt's visitor nodding off in chair.  

## 2021-10-21 ENCOUNTER — Other Ambulatory Visit: Payer: Self-pay

## 2021-10-21 ENCOUNTER — Encounter (HOSPITAL_COMMUNITY): Payer: Self-pay

## 2021-10-21 ENCOUNTER — Emergency Department (HOSPITAL_COMMUNITY)
Admission: EM | Admit: 2021-10-21 | Discharge: 2021-10-21 | Disposition: A | Discharge status: Home / Self Care | Payer: Commercial Managed Care - HMO | Attending: Emergency Medicine | Admitting: Emergency Medicine

## 2021-10-21 DIAGNOSIS — N390 Urinary tract infection, site not specified: Secondary | ICD-10-CM | POA: Insufficient documentation

## 2021-10-21 DIAGNOSIS — R300 Dysuria: Secondary | ICD-10-CM | POA: Diagnosis present

## 2021-10-21 DIAGNOSIS — R110 Nausea: Secondary | ICD-10-CM | POA: Insufficient documentation

## 2021-10-21 DIAGNOSIS — N9489 Other specified conditions associated with female genital organs and menstrual cycle: Secondary | ICD-10-CM | POA: Diagnosis not present

## 2021-10-21 DIAGNOSIS — R509 Fever, unspecified: Secondary | ICD-10-CM | POA: Diagnosis not present

## 2021-10-21 DIAGNOSIS — R350 Frequency of micturition: Secondary | ICD-10-CM | POA: Insufficient documentation

## 2021-10-21 LAB — COMPREHENSIVE METABOLIC PANEL
ALT: 22 U/L (ref 0–44)
AST: 19 U/L (ref 15–41)
Albumin: 3.1 g/dL — ABNORMAL LOW (ref 3.5–5.0)
Alkaline Phosphatase: 65 U/L (ref 38–126)
Anion gap: 11 (ref 5–15)
BUN: <5 mg/dL — ABNORMAL LOW (ref 6–20)
CO2: 25 mmol/L (ref 22–32)
Calcium: 8.8 mg/dL — ABNORMAL LOW (ref 8.9–10.3)
Chloride: 103 mmol/L (ref 98–111)
Creatinine, Ser: 0.68 mg/dL (ref 0.44–1.00)
GFR, Estimated: >60 mL/min (ref >60)
Glucose, Bld: 105 mg/dL — ABNORMAL HIGH (ref 70–99)
Potassium: 3.9 mmol/L (ref 3.5–5.1)
Sodium: 139 mmol/L (ref 135–145)
Total Bilirubin: 0.5 mg/dL (ref 0.3–1.2)
Total Protein: 6.9 g/dL (ref 6.5–8.1)

## 2021-10-21 LAB — CBC WITH DIFFERENTIAL/PLATELET
Abs Immature Granulocytes: 0.02 10*3/uL (ref 0.00–0.07)
Basophils Absolute: 0 10*3/uL (ref 0.0–0.1)
Basophils Relative: 0 %
Eosinophils Absolute: 0.3 10*3/uL (ref 0.0–0.5)
Eosinophils Relative: 4 %
HCT: 34 % — ABNORMAL LOW (ref 36.0–46.0)
Hemoglobin: 11.2 g/dL — ABNORMAL LOW (ref 12.0–15.0)
Immature Granulocytes: 0 %
Lymphocytes Relative: 24 %
Lymphs Abs: 1.9 10*3/uL (ref 0.7–4.0)
MCH: 29.1 pg (ref 26.0–34.0)
MCHC: 32.9 g/dL (ref 30.0–36.0)
MCV: 88.3 fL (ref 80.0–100.0)
Monocytes Absolute: 0.9 10*3/uL (ref 0.1–1.0)
Monocytes Relative: 12 %
Neutro Abs: 4.8 10*3/uL (ref 1.7–7.7)
Neutrophils Relative %: 60 %
Platelets: 204 10*3/uL (ref 150–400)
RBC: 3.85 MIL/uL — ABNORMAL LOW (ref 3.87–5.11)
RDW: 12.4 % (ref 11.5–15.5)
WBC: 7.9 10*3/uL (ref 4.0–10.5)
nRBC: 0 % (ref 0.0–0.2)

## 2021-10-21 LAB — URINALYSIS, ROUTINE W REFLEX MICROSCOPIC
Bilirubin Urine: NEGATIVE
Glucose, UA: NEGATIVE mg/dL
Hgb urine dipstick: NEGATIVE
Ketones, ur: NEGATIVE mg/dL
Leukocytes,Ua: NEGATIVE
Nitrite: NEGATIVE
Protein, ur: NEGATIVE mg/dL
Specific Gravity, Urine: 1.004 — ABNORMAL LOW (ref 1.005–1.030)
pH: 6 (ref 5.0–8.0)

## 2021-10-21 LAB — I-STAT BETA HCG BLOOD, ED (MC, WL, AP ONLY): I-stat hCG, quantitative: <5 m[IU]/mL (ref <5)

## 2021-10-21 MED ORDER — CIPROFLOXACIN HCL 500 MG PO TABS: 500.0000 mg | ORAL_TABLET | Freq: Two times a day (BID) | ORAL | 0 refills | Status: AC

## 2021-10-21 NOTE — ED Triage Notes (Signed)
Reports a week ago having pain in back and burning with urination.  Reports 5 years ago was septic due to pyleo.  Patient was seen at [facility] yesterday but reporst they were rude so she left.  Triage RN note reports she appeared impaired during triage

## 2021-10-21 NOTE — ED Provider Notes (Addendum)
[facility] EMERGENCY DEPARTMENT Provider Note   CSN: [id number] Arrival date & time: 10/21/21  1039     History  Chief Complaint  Patient presents with   Dysuria    Christine Mcguire is a 36 y.o. female.  HPI 36 year old female sent today complaining of urinary frequency for the past several days.  She states that she had some Cipro at home and took it for 3 days and continues to have some dysuria.  She reports that she had a UTI and sepsis recently.  She says she was in the hospital in [place].  She reports subjective fever and some nausea but no vomiting    Home Medications Prior to Admission medications   Medication Sig Start Date End Date Taking? Authorizing Provider  ciprofloxacin (CIPRO) 500 MG tablet Take 1 tablet (500 mg total) by mouth every 12 (twelve) hours for 7 days. 10/21/21 10/28/21 Yes Margarita Grizzle, MD  meloxicam (MOBIC) 15 MG tablet Take 1 tablet (15 mg total) by mouth daily. 07/02/20   Asencion Islam, DPM      Allergies    Patient has no known allergies.    Review of Systems   Review of Systems  Physical Exam Updated Vital Signs BP (!) 111/42 (BP Location: Right Arm)   Pulse 63   Temp 98.5 F (36.9 C)   Resp 16   Ht 1.626 m (5\' 4" )   Wt 72.6 kg   SpO2 98%   BMI 27.46 kg/m  Physical Exam Vitals and nursing note reviewed.  Constitutional:      General: She is not in acute distress.    Appearance: She is well-developed.  HENT:     Head: Normocephalic and atraumatic.     Right Ear: External ear normal.     Left Ear: External ear normal.     Nose: Nose normal.  Eyes:     Conjunctiva/sclera: Conjunctivae normal.     Pupils: Pupils are equal, round, and reactive to light.  Cardiovascular:     Rate and Rhythm: Normal rate and regular rhythm.  Pulmonary:     Effort: Pulmonary effort is normal.  Abdominal:     General: Abdomen is flat. Bowel sounds are normal.     Palpations: Abdomen is soft.     Tenderness: There is no right  CVA tenderness or left CVA tenderness.  Musculoskeletal:        General: Normal range of motion.     Cervical back: Normal range of motion and neck supple.  Skin:    General: Skin is warm and dry.  Neurological:     Mental Status: She is alert and oriented to person, place, and time.     Motor: No abnormal muscle tone.     Coordination: Coordination normal.  Psychiatric:        Behavior: Behavior normal.        Thought Content: Thought content normal.     ED Results / Procedures / Treatments   Labs (all labs ordered are listed, but only abnormal results are displayed) Labs Reviewed  CBC WITH DIFFERENTIAL/PLATELET - Abnormal; Notable for the following components:      Result Value   RBC 3.85 (*)    Hemoglobin 11.2 (*)    HCT 34.0 (*)    All other components within normal limits  COMPREHENSIVE METABOLIC PANEL - Abnormal; Notable for the following components:   Glucose, Bld 105 (*)    BUN <5 (*)    Calcium 8.8 (*)  Albumin 3.1 (*)    All other components within normal limits  URINALYSIS, ROUTINE W REFLEX MICROSCOPIC - Abnormal; Notable for the following components:   Specific Gravity, Urine 1.004 (*)    All other components within normal limits  I-STAT BETA HCG BLOOD, ED ([facility], [facility], [facility] ONLY)    EKG None  Radiology No results found.  Procedures Procedures    Medications Ordered in ED Medications - No data to display  ED Course/ Medical Decision Making/ A&P                           Medical Decision Making 36 year old female history of urinary tract infection and sepsis presents today complaining of pain with urination.  She reports that she took 3 days of Cipro but is continued to have some burning with urination.  Urinalysis here is clear.  She denies any abnormal vaginal discharge or UTI symptoms.  Her pregnancy test is negative.  White blood cell count is normal. Differential diagnosis includes but is not limited to partially treated urinary tract infection,  other causes of urethritis, external vaginal etiologies of dysuria.  Patient denies any STI symptoms.  Given that she has had significant urinary tract infections in the past and has taken a couple days of Cipro, will continue to treat. Labs were checked and patient has mild anemia but has improved from prior Patient appears stable for discharge  Risk Prescription drug management.           Final Clinical Impression(s) / ED Diagnoses Final diagnoses:  Dysuria    Rx / DC Orders ED Discharge Orders          Ordered    ciprofloxacin (CIPRO) 500 MG tablet  Every 12 hours        10/21/21 1548              Margarita Grizzle, MD 10/21/21 1552    Margarita Grizzle, MD 10/21/21 1555

## 2021-10-21 NOTE — ED Notes (Signed)
Reviewed discharge instructions with patient. Follow-up care and medications reviewed. Patient  verbalized understanding. Patient A&Ox4, VSS, and ambulatory with steady gait upon discharge.  °

## 2021-10-21 NOTE — Discharge Instructions (Addendum)
Your urinalysis appears clear today.  This could be due to partially due to infection due to the antibiotics you already took.  You are given a prescription for an additional week please take these back to back. Return for reevaluation if you are worse especially fever, inability to tolerate medications or other new or worsening symptoms Your hemoglobin is slightly low at 11.  However, it is improving from previous ones were we have evaluated these.  Please continue to follow-up with your primary care doctor.

## 2021-10-21 NOTE — ED Provider Triage Note (Signed)
Emergency Medicine Provider Triage Evaluation Note  Christine Mcguire , a 36 y.o. female  was evaluated in triage.  Pt complains of dysuria, urinary urgency, urinary frequency for the past 2 weeks.  Patient reports that she has history of pyelonephritis and sepsis and is concerned about this.  She also ports some lightheadedness.  Some lower abdominal pain, no nausea or vomiting.  No recorded temperature.  Review of Systems  Positive:  Negative:   Physical Exam  BP (!) 115/48   Pulse 82   Temp 98 F (36.7 C) (Oral)   Resp 16   Ht 5\' 4"  (1.626 m)   Wt 72.6 kg   SpO2 100%   BMI 27.46 kg/m  Gen:   Awake, no distress   Resp:  Normal effort  MSK:   Moves extremities without difficulty  Other:    Medical Decision Making  Medically screening exam initiated at 11:16 AM.  Appropriate orders placed.  Ravin Bendall was informed that the remainder of the evaluation will be completed by another provider, this initial triage assessment does not replace that evaluation, and the importance of remaining in the ED until their evaluation is complete.  Will order labs.   Virginia Crews, Achille Rich 10/21/21 1117

## 2022-10-10 DIAGNOSIS — F1120 Opioid dependence, uncomplicated: Secondary | ICD-10-CM | POA: Diagnosis not present

## 2022-10-10 DIAGNOSIS — B182 Chronic viral hepatitis C: Secondary | ICD-10-CM | POA: Diagnosis not present

## 2022-10-11 DIAGNOSIS — B182 Chronic viral hepatitis C: Secondary | ICD-10-CM | POA: Diagnosis not present

## 2022-10-11 DIAGNOSIS — F1120 Opioid dependence, uncomplicated: Secondary | ICD-10-CM | POA: Diagnosis not present

## 2022-10-23 DIAGNOSIS — F1120 Opioid dependence, uncomplicated: Secondary | ICD-10-CM | POA: Diagnosis not present

## 2022-10-23 DIAGNOSIS — F17200 Nicotine dependence, unspecified, uncomplicated: Secondary | ICD-10-CM | POA: Diagnosis not present

## 2022-11-09 DIAGNOSIS — F17200 Nicotine dependence, unspecified, uncomplicated: Secondary | ICD-10-CM | POA: Diagnosis not present

## 2022-11-09 DIAGNOSIS — F1120 Opioid dependence, uncomplicated: Secondary | ICD-10-CM | POA: Diagnosis not present

## 2022-11-11 DIAGNOSIS — F17200 Nicotine dependence, unspecified, uncomplicated: Secondary | ICD-10-CM | POA: Diagnosis not present

## 2022-11-11 DIAGNOSIS — F1120 Opioid dependence, uncomplicated: Secondary | ICD-10-CM | POA: Diagnosis not present

## 2022-11-16 DIAGNOSIS — F1120 Opioid dependence, uncomplicated: Secondary | ICD-10-CM | POA: Diagnosis not present

## 2022-11-30 DIAGNOSIS — F32A Depression, unspecified: Secondary | ICD-10-CM | POA: Diagnosis not present

## 2022-11-30 DIAGNOSIS — F1120 Opioid dependence, uncomplicated: Secondary | ICD-10-CM | POA: Diagnosis not present

## 2022-12-11 DIAGNOSIS — F32A Depression, unspecified: Secondary | ICD-10-CM | POA: Diagnosis not present

## 2022-12-11 DIAGNOSIS — F1120 Opioid dependence, uncomplicated: Secondary | ICD-10-CM | POA: Diagnosis not present

## 2022-12-12 DIAGNOSIS — F1120 Opioid dependence, uncomplicated: Secondary | ICD-10-CM | POA: Diagnosis not present

## 2022-12-28 DIAGNOSIS — F32A Depression, unspecified: Secondary | ICD-10-CM | POA: Diagnosis not present

## 2022-12-28 DIAGNOSIS — F1120 Opioid dependence, uncomplicated: Secondary | ICD-10-CM | POA: Diagnosis not present

## 2023-01-11 DIAGNOSIS — F1120 Opioid dependence, uncomplicated: Secondary | ICD-10-CM | POA: Diagnosis not present

## 2023-01-11 DIAGNOSIS — F32A Depression, unspecified: Secondary | ICD-10-CM | POA: Diagnosis not present
